# Patient Record
Sex: Male | Born: 1950
Health system: Southern US, Community
[De-identification: ages and names within clinical notes are randomized; demographics above are authoritative.]

## PROBLEM LIST (undated history)

## (undated) DIAGNOSIS — F17200 Nicotine dependence, unspecified, uncomplicated: Secondary | ICD-10-CM

## (undated) DIAGNOSIS — I10 Essential (primary) hypertension: Secondary | ICD-10-CM

## (undated) DIAGNOSIS — Z9109 Other allergy status, other than to drugs and biological substances: Secondary | ICD-10-CM

## (undated) DIAGNOSIS — K649 Unspecified hemorrhoids: Secondary | ICD-10-CM

## (undated) HISTORY — DX: Other allergy status, other than to drugs and biological substances: Z91.09

## (undated) HISTORY — DX: Essential (primary) hypertension: I10

## (undated) HISTORY — PX: HIP SURGERY: SHX245

## (undated) HISTORY — PX: BRAIN SURGERY: SHX531

## (undated) HISTORY — DX: Unspecified hemorrhoids: K64.9

---

## 2003-08-21 ENCOUNTER — Emergency Department (HOSPITAL_COMMUNITY): Admission: EM | Admit: 2003-08-21 | Discharge: 2003-08-21 | Payer: Self-pay | Admitting: Emergency Medicine

## 2004-01-09 ENCOUNTER — Emergency Department (HOSPITAL_COMMUNITY): Admission: EM | Admit: 2004-01-09 | Discharge: 2004-01-09 | Payer: Self-pay | Admitting: Emergency Medicine

## 2007-04-14 ENCOUNTER — Emergency Department (HOSPITAL_COMMUNITY): Admission: EM | Admit: 2007-04-14 | Discharge: 2007-04-15 | Payer: Self-pay | Admitting: Emergency Medicine

## 2010-05-24 HISTORY — PX: HERNIA REPAIR: SHX51

## 2013-03-21 ENCOUNTER — Encounter (HOSPITAL_COMMUNITY): Payer: Self-pay | Admitting: Emergency Medicine

## 2013-03-21 ENCOUNTER — Emergency Department (INDEPENDENT_AMBULATORY_CARE_PROVIDER_SITE_OTHER)
Admission: EM | Admit: 2013-03-21 | Discharge: 2013-03-21 | Disposition: A | Discharge status: Home / Self Care | Payer: Self-pay | Source: Home / Self Care

## 2013-03-21 ENCOUNTER — Emergency Department (INDEPENDENT_AMBULATORY_CARE_PROVIDER_SITE_OTHER): Payer: Self-pay

## 2013-03-21 DIAGNOSIS — S52599A Other fractures of lower end of unspecified radius, initial encounter for closed fracture: Secondary | ICD-10-CM

## 2013-03-21 DIAGNOSIS — S52501A Unspecified fracture of the lower end of right radius, initial encounter for closed fracture: Secondary | ICD-10-CM

## 2013-03-21 MED ORDER — HYDROCODONE-ACETAMINOPHEN 5-325 MG PO TABS: 1.0000 | ORAL_TABLET | ORAL | Status: DC | PRN

## 2013-03-21 MED ORDER — HYDROCODONE-ACETAMINOPHEN 5-325 MG PO TABS
ORAL_TABLET | ORAL | Status: AC
  Filled 2013-03-21: qty 2

## 2013-03-21 MED ORDER — HYDROCODONE-ACETAMINOPHEN 5-325 MG PO TABS
2.0000 | ORAL_TABLET | Freq: Once | ORAL | Status: AC
  Administered 2013-03-21: 2 via ORAL

## 2013-03-21 NOTE — Progress Notes (Signed)
Orthopedic Tech Progress Note Patient Details:  Michael Fields [birth year] [id number] Applied fiberglass volar short arm splint to RUE.  Pulses, capillary refill, motion and sensation intact before and after splinting.  Capillary refill less than 2 seconds.  Applied arm sling to RUE. Ortho Devices Type of Ortho Device: Short arm splint;Arm sling Ortho Device/Splint Location: RUE Ortho Device/Splint Interventions: Application   Lesle Chris 03/21/2013, 1:31 PM

## 2013-03-21 NOTE — ED Provider Notes (Signed)
Medical screening examination/treatment/procedure(s) were performed by non-physician practitioner and as supervising physician I was immediately available for consultation/collaboration.  Leslee Home, M.D.  Reuben Likes, MD 03/21/13 [phone]

## 2013-03-21 NOTE — ED Notes (Signed)
Pt c/o right wrist inj onset yest around 1200 Reports he was walking when he tripped and fell onto sidewalk/concrete [place] on his knees and hands Sxs include: swelling, tenderness... Pain increases w/activity Denies: head inj/LOC Alert w/no signs of acute distress.

## 2013-03-21 NOTE — ED Notes (Signed)
Ortho tech has arrived 

## 2013-03-21 NOTE — ED Provider Notes (Signed)
CSN: [id number]     Arrival date & time 03/21/13  4098 History   First MD Initiated Contact with Patient 03/21/13 1050     Chief Complaint  Patient presents with  . Wrist Injury   (Consider location/radiation/quality/duration/timing/severity/associated sxs/prior Treatment) HPI Comments: This 62 year old man was walking yesterday and experienced a mechanical fall landing on his outstretched right hand. He is complaining of pain primarily to the wrist with swelling to the wrist and hand. Denies injury elsewhere.   History reviewed. No pertinent past medical history. Past Surgical History  Procedure Laterality Date  . Brain surgery     No family history on file. History  Substance Use Topics  . Smoking status: Current Every Day Smoker -- 0.50 packs/day    Types: Cigarettes  . Smokeless tobacco: Not on file  . Alcohol Use: Yes    Review of Systems  Constitutional: Negative.   Respiratory: Negative.   Gastrointestinal: Negative.   Genitourinary: Negative.   Musculoskeletal: Positive for joint swelling. Negative for neck pain and neck stiffness.       As per HPI  Skin: Negative.   Neurological: Negative for dizziness, weakness, numbness and headaches.    Allergies  Review of patient's allergies indicates no known allergies.  Home Medications   Current Outpatient Rx  Name  Route  Sig  Dispense  Refill  . HYDROcodone-acetaminophen (NORCO/VICODIN) 5-325 MG per tablet   Oral   Take 1 tablet by mouth every 4 (four) hours as needed for pain.   20 tablet   0    BP 144/83  Pulse 64  Temp(Src) 98.4 F (36.9 C) (Oral)  Resp 20  SpO2 98% Physical Exam  Nursing note and vitals reviewed. Constitutional: He is oriented to person, place, and time. He appears well-developed and well-nourished.  HENT:  Head: Normocephalic and atraumatic.  Eyes: EOM are normal. Left eye exhibits no discharge.  Neck: Normal range of motion. Neck supple.  Cardiovascular: Normal rate.    Pulmonary/Chest: Effort normal.  Musculoskeletal:  There is swelling and tenderness to the wrist. Radial pulses 1+. There is swelling to the hand but no tenderness to the med carpals. Able to move his digits. Capillary refill is less than 3 seconds. Distal sensation is normal. No tenderness to the proximal forearm, elbow or upper arm.  Neurological: He is alert and oriented to person, place, and time. No cranial nerve deficit.  Skin: Skin is warm and dry.  Psychiatric: He has a normal mood and affect.    ED Course  Procedures (including critical care time) Labs Review Labs Reviewed - No data to display Imaging Review Dg Wrist Complete Right  03/21/2013   CLINICAL DATA:  Status post fall  EXAM: RIGHT WRIST - COMPLETE 3+ VIEW  COMPARISON:  None.  FINDINGS: There is a comminuted, intra-articular fracture deformity involving the distal radius. Fracture fragments are slightly impacted.  No dislocations.  No radiopaque foreign bodies are soft tissue calcifications.  IMPRESSION: 1. Comminuted distal radius fracture appears slightly impacted.   Electronically Signed   By: Signa Kell M.D.   On: 03/21/2013 11:11     \  MDM   1. Closed fracture of right distal radius, initial encounter      Spoke with Molly Maduro, PA at Dr. Ronie Spies office at 11:40 AM. After reviewing the film and discussing with Dr. Mina Marble the recommendation is for [place] to put a plaster splint to the wrist and keep it elevated use a sling and see him in the  office next Tuesday, November 4. He is to call the office to obtain a time for the appointment and also task for [place]. Norco 5 mg every 4 hours when necessary pain #20.  Michael Rasmussen, NP 03/21/13 1230

## 2013-03-29 ENCOUNTER — Encounter (HOSPITAL_BASED_OUTPATIENT_CLINIC_OR_DEPARTMENT_OTHER): Payer: Self-pay | Admitting: *Deleted

## 2013-03-29 ENCOUNTER — Other Ambulatory Visit: Payer: Self-pay | Admitting: Orthopedic Surgery

## 2013-03-29 NOTE — Progress Notes (Signed)
Denies any heart or resp problems but does not see dr-said her had a hernia repair [facility] but no records

## 2013-04-02 ENCOUNTER — Encounter (HOSPITAL_BASED_OUTPATIENT_CLINIC_OR_DEPARTMENT_OTHER): Payer: Self-pay | Admitting: *Deleted

## 2013-04-02 ENCOUNTER — Ambulatory Visit (HOSPITAL_BASED_OUTPATIENT_CLINIC_OR_DEPARTMENT_OTHER): Payer: Self-pay | Admitting: Anesthesiology

## 2013-04-02 ENCOUNTER — Ambulatory Visit (HOSPITAL_BASED_OUTPATIENT_CLINIC_OR_DEPARTMENT_OTHER)
Admission: RE | Admit: 2013-04-02 | Discharge: 2013-04-02 | Disposition: A | Discharge status: Home / Self Care | Payer: Self-pay | Source: Ambulatory Visit | Attending: Orthopedic Surgery | Admitting: Orthopedic Surgery

## 2013-04-02 ENCOUNTER — Encounter (HOSPITAL_BASED_OUTPATIENT_CLINIC_OR_DEPARTMENT_OTHER): Payer: Self-pay | Admitting: Anesthesiology

## 2013-04-02 ENCOUNTER — Encounter (HOSPITAL_BASED_OUTPATIENT_CLINIC_OR_DEPARTMENT_OTHER)
Admission: RE | Disposition: A | Discharge status: Home / Self Care | Payer: Self-pay | Source: Ambulatory Visit | Attending: Orthopedic Surgery

## 2013-04-02 DIAGNOSIS — S52531A Colles' fracture of right radius, initial encounter for closed fracture: Secondary | ICD-10-CM

## 2013-04-02 DIAGNOSIS — S52539A Colles' fracture of unspecified radius, initial encounter for closed fracture: Secondary | ICD-10-CM | POA: Insufficient documentation

## 2013-04-02 DIAGNOSIS — R296 Repeated falls: Secondary | ICD-10-CM | POA: Insufficient documentation

## 2013-04-02 HISTORY — PX: ORIF WRIST FRACTURE: SHX2133

## 2013-04-02 LAB — POCT I-STAT, CHEM 8
BUN: 21 mg/dL (ref 6–23)
Calcium, Ion: 1.08 mmol/L — ABNORMAL LOW (ref 1.13–1.30)
Chloride: 107 mEq/L (ref 96–112)
Creatinine, Ser: 0.9 mg/dL (ref 0.50–1.35)
Glucose, Bld: 94 mg/dL (ref 70–99)
HCT: 43 % (ref 39.0–52.0)
Hemoglobin: 14.6 g/dL (ref 13.0–17.0)
Potassium: 5 mEq/L (ref 3.5–5.1)
Sodium: 139 mEq/L (ref 135–145)
TCO2: 24 mmol/L (ref 0–100)

## 2013-04-02 SURGERY — OPEN REDUCTION INTERNAL FIXATION (ORIF) WRIST FRACTURE
Anesthesia: General | Site: Wrist | Laterality: Right | Wound class: Clean

## 2013-04-02 MED ORDER — CEFAZOLIN SODIUM 1-5 GM-% IV SOLN
INTRAVENOUS | Status: AC
  Filled 2013-04-02: qty 100

## 2013-04-02 MED ORDER — DEXAMETHASONE SODIUM PHOSPHATE 10 MG/ML IJ SOLN
INTRAMUSCULAR | Status: DC | PRN
  Administered 2013-04-02: 10 mg via INTRAVENOUS

## 2013-04-02 MED ORDER — CEFAZOLIN SODIUM-DEXTROSE 2-3 GM-% IV SOLR
2.0000 g | INTRAVENOUS | Status: AC
  Administered 2013-04-02: 2 g via INTRAVENOUS

## 2013-04-02 MED ORDER — FENTANYL CITRATE 0.05 MG/ML IJ SOLN
INTRAMUSCULAR | Status: AC
  Filled 2013-04-02: qty 2

## 2013-04-02 MED ORDER — FENTANYL CITRATE 0.05 MG/ML IJ SOLN
50.0000 ug | INTRAMUSCULAR | Status: DC | PRN
  Administered 2013-04-02: 100 ug via INTRAVENOUS

## 2013-04-02 MED ORDER — LIDOCAINE HCL (CARDIAC) 20 MG/ML IV SOLN
INTRAVENOUS | Status: DC | PRN
  Administered 2013-04-02: 50 mg via INTRAVENOUS

## 2013-04-02 MED ORDER — LACTATED RINGERS IV SOLN
INTRAVENOUS | Status: DC
  Administered 2013-04-02 (×2): via INTRAVENOUS

## 2013-04-02 MED ORDER — PROPOFOL 10 MG/ML IV EMUL
INTRAVENOUS | Status: AC
  Filled 2013-04-02: qty 50

## 2013-04-02 MED ORDER — MIDAZOLAM HCL 5 MG/5ML IJ SOLN
INTRAMUSCULAR | Status: DC | PRN
  Administered 2013-04-02: 2 mg via INTRAVENOUS

## 2013-04-02 MED ORDER — CHLORHEXIDINE GLUCONATE 4 % EX LIQD: 60.0000 mL | Freq: Once | CUTANEOUS | Status: DC

## 2013-04-02 MED ORDER — OXYCODONE-ACETAMINOPHEN 5-325 MG PO TABS: 1.0000 | ORAL_TABLET | ORAL | Status: DC | PRN

## 2013-04-02 MED ORDER — BUPIVACAINE-EPINEPHRINE PF 0.25-1:200000 % IJ SOLN
INTRAMUSCULAR | Status: AC
  Filled 2013-04-02: qty 30

## 2013-04-02 MED ORDER — MIDAZOLAM HCL 2 MG/2ML IJ SOLN
1.0000 mg | INTRAMUSCULAR | Status: DC | PRN
  Administered 2013-04-02: 2 mg via INTRAVENOUS

## 2013-04-02 MED ORDER — HYDROMORPHONE HCL PF 1 MG/ML IJ SOLN: 0.2500 mg | INTRAMUSCULAR | Status: DC | PRN

## 2013-04-02 MED ORDER — ONDANSETRON HCL 4 MG/2ML IJ SOLN: 4.0000 mg | Freq: Once | INTRAMUSCULAR | Status: DC | PRN

## 2013-04-02 MED ORDER — MIDAZOLAM HCL 2 MG/2ML IJ SOLN
INTRAMUSCULAR | Status: AC
  Filled 2013-04-02: qty 2

## 2013-04-02 MED ORDER — BUPIVACAINE HCL (PF) 0.25 % IJ SOLN
INTRAMUSCULAR | Status: AC
  Filled 2013-04-02: qty 30

## 2013-04-02 MED ORDER — PROPOFOL 10 MG/ML IV BOLUS
INTRAVENOUS | Status: DC | PRN
  Administered 2013-04-02: 190 mg via INTRAVENOUS

## 2013-04-02 MED ORDER — ONDANSETRON HCL 4 MG/2ML IJ SOLN
INTRAMUSCULAR | Status: DC | PRN
  Administered 2013-04-02: 4 mg via INTRAVENOUS

## 2013-04-02 SURGICAL SUPPLY — 71 items
APL SKNCLS STERI-STRIP NONHPOA (GAUZE/BANDAGES/DRESSINGS)
BAG DECANTER FOR FLEXI CONT (MISCELLANEOUS) IMPLANT
BANDAGE ELASTIC 3 VELCRO ST LF (GAUZE/BANDAGES/DRESSINGS) IMPLANT
BANDAGE ELASTIC 4 VELCRO ST LF (GAUZE/BANDAGES/DRESSINGS) ×2 IMPLANT
BANDAGE GAUZE ELAST BULKY 4 IN (GAUZE/BANDAGES/DRESSINGS) ×2 IMPLANT
BENZOIN TINCTURE PRP APPL 2/3 (GAUZE/BANDAGES/DRESSINGS) IMPLANT
BIT DRILL 2 FAST STEP (BIT) ×2 IMPLANT
BIT DRILL 2.5X4 QC (BIT) ×2 IMPLANT
BLADE MINI RND TIP GREEN BEAV (BLADE) IMPLANT
BLADE SURG 15 STRL LF DISP TIS (BLADE) ×1 IMPLANT
BLADE SURG 15 STRL SS (BLADE) ×2
BNDG CMPR 9X4 STRL LF SNTH (GAUZE/BANDAGES/DRESSINGS) ×1
BNDG ESMARK 4X9 LF (GAUZE/BANDAGES/DRESSINGS) ×2 IMPLANT
CANISTER SUCT 1200ML W/VALVE (MISCELLANEOUS) IMPLANT
CORDS BIPOLAR (ELECTRODE) ×2 IMPLANT
COVER TABLE BACK 60X90 (DRAPES) ×2 IMPLANT
CUFF TOURNIQUET SINGLE 18IN (TOURNIQUET CUFF) ×2 IMPLANT
DECANTER SPIKE VIAL GLASS SM (MISCELLANEOUS) IMPLANT
DRAPE EXTREMITY T 121X128X90 (DRAPE) ×2 IMPLANT
DRAPE OEC MINIVIEW 54X84 (DRAPES) ×2 IMPLANT
DRAPE SURG 17X23 STRL (DRAPES) ×2 IMPLANT
DURAPREP 26ML APPLICATOR (WOUND CARE) ×2 IMPLANT
ELECT REM PT RETURN 9FT ADLT (ELECTROSURGICAL)
ELECTRODE REM PT RTRN 9FT ADLT (ELECTROSURGICAL) IMPLANT
GAUZE SPONGE 4X4 16PLY XRAY LF (GAUZE/BANDAGES/DRESSINGS) IMPLANT
GAUZE XEROFORM 1X8 LF (GAUZE/BANDAGES/DRESSINGS) ×2 IMPLANT
GLOVE BIO SURGEON STRL SZ 6.5 (GLOVE) ×2 IMPLANT
GLOVE BIO SURGEON STRL SZ8 (GLOVE) ×2 IMPLANT
GLOVE BIOGEL PI IND STRL 7.0 (GLOVE) ×3 IMPLANT
GLOVE BIOGEL PI INDICATOR 7.0 (GLOVE) ×3
GLOVE ECLIPSE 6.5 STRL STRAW (GLOVE) ×4 IMPLANT
GOWN BRE IMP PREV XXLGXLNG (GOWN DISPOSABLE) ×2 IMPLANT
GOWN PREVENTION PLUS XLARGE (GOWN DISPOSABLE) ×6 IMPLANT
NEEDLE HYPO 25X1 1.5 SAFETY (NEEDLE) IMPLANT
NS IRRIG 1000ML POUR BTL (IV SOLUTION) ×2 IMPLANT
PACK BASIN DAY SURGERY FS (CUSTOM PROCEDURE TRAY) ×2 IMPLANT
PAD CAST 3X4 CTTN HI CHSV (CAST SUPPLIES) ×1 IMPLANT
PAD CAST 4YDX4 CTTN HI CHSV (CAST SUPPLIES) IMPLANT
PADDING CAST ABS 4INX4YD NS (CAST SUPPLIES)
PADDING CAST ABS COTTON 4X4 ST (CAST SUPPLIES) IMPLANT
PADDING CAST COTTON 3X4 STRL (CAST SUPPLIES) ×2
PADDING CAST COTTON 4X4 STRL (CAST SUPPLIES)
PEG SUBCHONDRAL SMOOTH 2.0X24 (Peg) ×8 IMPLANT
PEG SUBCHONDRAL SMOOTH 2.0X26 (Peg) ×6 IMPLANT
PENCIL BUTTON HOLSTER BLD 10FT (ELECTRODE) IMPLANT
PLATE STAN 24.4X59.5 RT (Plate) ×2 IMPLANT
SCREW CORT 3.5X14 LNG (Screw) ×4 IMPLANT
SCREW CORT 3.5X16 LNG (Screw) ×2 IMPLANT
SHEET MEDIUM DRAPE 40X70 STRL (DRAPES) ×2 IMPLANT
SLEEVE SCD COMPRESS KNEE MED (MISCELLANEOUS) ×2 IMPLANT
SLING ARM FOAM STRAP LRG (SOFTGOODS) ×2 IMPLANT
SPLINT PLASTER CAST XFAST 3X15 (CAST SUPPLIES) IMPLANT
SPLINT PLASTER CAST XFAST 4X15 (CAST SUPPLIES) ×15 IMPLANT
SPLINT PLASTER XTRA FAST SET 4 (CAST SUPPLIES) ×15
SPLINT PLASTER XTRA FASTSET 3X (CAST SUPPLIES)
SPONGE GAUZE 4X4 12PLY (GAUZE/BANDAGES/DRESSINGS) ×2 IMPLANT
STOCKINETTE 4X48 STRL (DRAPES) ×2 IMPLANT
STRIP CLOSURE SKIN 1/2X4 (GAUZE/BANDAGES/DRESSINGS) IMPLANT
SUCTION FRAZIER TIP 10 FR DISP (SUCTIONS) IMPLANT
SUT ETHILON 4 0 PS 2 18 (SUTURE) IMPLANT
SUT MERSILENE 4 0 P 3 (SUTURE) IMPLANT
SUT PROLENE 3 0 PS 2 (SUTURE) IMPLANT
SUT SILK 2 0 FS (SUTURE) IMPLANT
SUT VIC AB 0 SH 27 (SUTURE) ×2 IMPLANT
SUT VIC AB 3-0 FS2 27 (SUTURE) IMPLANT
SUT VICRYL RAPIDE 4/0 PS 2 (SUTURE) ×2 IMPLANT
SYR BULB 3OZ (MISCELLANEOUS) ×2 IMPLANT
SYRINGE 10CC LL (SYRINGE) IMPLANT
TOWEL OR 17X24 6PK STRL BLUE (TOWEL DISPOSABLE) ×2 IMPLANT
TUBE CONNECTING 20X1/4 (TUBING) IMPLANT
UNDERPAD 30X30 INCONTINENT (UNDERPADS AND DIAPERS) ×2 IMPLANT

## 2013-04-02 NOTE — Transfer of Care (Signed)
Immediate Anesthesia Transfer of Care Note  Patient: Michael Fields  Procedure(s) Performed: Procedure(s): RIGHT OPEN REDUCTION INTERNAL FIXATION (ORIF) WRIST DISTAL RADIUS FRACTURE (Right)  Patient Location: PACU  Anesthesia Type:GA combined with regional for post-op pain  Level of Consciousness: sedated  Airway & Oxygen Therapy: Patient Spontanous Breathing and Patient connected to face mask oxygen  Post-op Assessment: Report given to PACU RN and Post -op Vital signs reviewed and stable  Post vital signs: Reviewed and stable  Complications: No apparent anesthesia complications

## 2013-04-02 NOTE — Anesthesia Procedure Notes (Addendum)
Anesthesia Regional Block:  Supraclavicular block  Pre-Anesthetic Checklist: ,, timeout performed, Correct Patient, Correct Site, Correct Laterality, Correct Procedure, Correct Position, site marked, Risks and benefits discussed,  Surgical consent,  Pre-op evaluation,  At surgeon's request and post-op pain management  Laterality: Left  Prep: chloraprep       Needles:  Injection technique: Single-shot  Needle Type: Echogenic Stimulator Needle      Needle Gauge: 22 and 22 G    Additional Needles:  Procedures: ultrasound guided (picture in chart) and nerve stimulator Supraclavicular block Narrative:  Start time: 04/02/2013 10:35 AM End time: 04/02/2013 10:45 AM Injection made incrementally with aspirations every 5 mL.  Performed by: Personally   Additional Notes: 30 cc 0.5% marcaine with 1:200 epi and 5 mg decadron injected easily  Kipp Brood, MD  Supraclavicular block Procedure Name: LMA Insertion Date/Time: 04/02/2013 11:12 AM Performed by: Burna Cash Pre-anesthesia Checklist: Patient identified, Emergency Drugs available, Suction available and Patient being monitored Patient Re-evaluated:Patient Re-evaluated prior to inductionOxygen Delivery Method: Circle System Utilized Preoxygenation: Pre-oxygenation with 100% oxygen Intubation Type: IV induction Ventilation: Mask ventilation without difficulty LMA: LMA inserted LMA Size: 5.0 Number of attempts: 1 Airway Equipment and Method: bite block Placement Confirmation: positive ETCO2 Tube secured with: Tape Dental Injury: Teeth and Oropharynx as per pre-operative assessment

## 2013-04-02 NOTE — Anesthesia Postprocedure Evaluation (Signed)
  Anesthesia Post-op Note  Patient: Michael Fields  Procedure(s) Performed: Procedure(s): RIGHT OPEN REDUCTION INTERNAL FIXATION (ORIF) WRIST DISTAL RADIUS FRACTURE (Right)  Patient Location: PACU  Anesthesia Type:GA combined with regional for post-op pain  Level of Consciousness: awake, alert  and oriented  Airway and Oxygen Therapy: Patient Spontanous Breathing  Post-op Pain: none  Post-op Assessment: Post-op Vital signs reviewed  Post-op Vital Signs: Reviewed  Complications: No apparent anesthesia complications

## 2013-04-02 NOTE — Op Note (Signed)
See note [id number]

## 2013-04-02 NOTE — Progress Notes (Signed)
Assisted Dr. Joslin with right, ultrasound guided, supraclavicular block. Side rails up, monitors on throughout procedure. See vital signs in flow sheet. Tolerated Procedure well. 
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
## 2013-04-02 NOTE — Anesthesia Preprocedure Evaluation (Signed)
Anesthesia Evaluation  Patient identified by MRN, date of birth, ID band Patient awake    Reviewed: Allergy & Precautions, H&P , NPO status , Patient's Chart, lab work & pertinent test results  Airway Mallampati: II TM Distance: >3 FB Neck ROM: Full    Dental  (+) Edentulous Lower, Loose and Dental Advisory Given,    Pulmonary  breath sounds clear to auscultation        Cardiovascular Rhythm:Regular Rate:Normal     Neuro/Psych    GI/Hepatic   Endo/Other    Renal/GU      Musculoskeletal   Abdominal   Peds  Hematology   Anesthesia Other Findings   Reproductive/Obstetrics                           Anesthesia Physical Anesthesia Plan  ASA: II  Anesthesia Plan: General   Post-op Pain Management:    Induction: Intravenous  Airway Management Planned: LMA  Additional Equipment:   Intra-op Plan:   Post-operative Plan:   Informed Consent: I have reviewed the patients History and Physical, chart, labs and discussed the procedure including the risks, benefits and alternatives for the proposed anesthesia with the patient or authorized representative who has indicated his/her understanding and acceptance.   Dental advisory given  Plan Discussed with: CRNA and Anesthesiologist  Anesthesia Plan Comments: (Fracture R. Wrist ETOH abuse Hx Loose lower front tooth  Plan GA with supraclavicular block  Kipp Brood, MD)        Anesthesia Quick Evaluation

## 2013-04-03 ENCOUNTER — Encounter (HOSPITAL_BASED_OUTPATIENT_CLINIC_OR_DEPARTMENT_OTHER): Payer: Self-pay | Admitting: Orthopedic Surgery

## 2013-04-03 NOTE — Op Note (Deleted)
[name], Fields NO.:  [id number]  MEDICAL RECORD NO.:  [id number]  LOCATION:  [facility]                         FACILITY:  [facility]  PHYSICIAN:  [name], M.D.DATE OF BIRTH:  Oct 28, 1950  DATE OF PROCEDURE:  04/02/2013 DATE OF DISCHARGE:  03/21/2013                              OPERATIVE REPORT   PREOPERATIVE DIAGNOSIS:  Displaced intra-articular fracture, distal radius, right side.  POSTOPERATIVE DIAGNOSIS:  Displaced intra-articular fracture, distal radius, right side.  PROCEDURE:  Open reduction and internal fixation above.  SURGEON:  [name], M.D.  ASSISTANT:  None.  ANESTHESIA:  Supraclavicular block.  Laryngeal mask airway general anesthetic.  COMPLICATION:  None.  DRAINS:  None.  DESCRIPTION OF PROCEDURE:  Patient was taken to the operating suite. After the induction of adequate axillary or supraclavicular laryngeal mask airway anesthetic, right upper extremity was prepped and draped in sterile fashion.  An Esmarch was used to exsanguinate the limb. Tourniquet was inflated to 250 mmHg.  At this point, an incision was made over the palpable border of flexor carpi radialis tendon.  Skin was incised longitudinally to 6-7 cm sheath overlying the FCR was incised. The radial artery was tracked to the lateral side of the FCR to the midline.  The fascia underlying this was incised.  Dissection was carried down to the level of pronator quadratus.  We sub-periosteally stripped to distal radius from the overlying pronator quadratus, exposing the fracture site with a large lunate facet fragment and radial styloid fragment.  Longitudinal traction flexion ulnar deviation was used to reduce the fracture.  We also released the brachioradialis off the distal fragment radially to help aid in reduction.  A standard DVR plate was then fastened to the lower aspect.  Distal radius was fixed to the slotted hole.  We used intraoperative  fluoroscopy to determine adequate position.  We then placed 2 more cortical screws proximally followed by smooth pegs distally.  Intraoperative fluoroscopy revealed adequate reduction in AP, lateral, oblique view.  Wound was irrigated and this was closed in layers of 2-0 undyed Vicryl for the pronator quadratus and a 4-0 Vicryl Rapide subcuticular stitch on the skin.  Steri-Strips, 4 x 4s, fluffs, and a volar splint was applied.  The patient tolerated the procedure well, went to recovery in stable fashion.     Artist Pais Mina Marble, M.D.     MAW/MEDQ  D:  04/02/2013  T:  04/03/2013  Job:  [id number]

## 2013-04-03 NOTE — Op Note (Signed)
Michael Fields, [place]O NO.:  [id number]  MEDICAL RECORD NO.:  [id number]  LOCATION:  [facility]                         FACILITY:  [facility]  PHYSICIAN:  Artist Pais. Devine Dant, M.D.DATE OF BIRTH:  12/19/1950  DATE OF PROCEDURE:  04/02/2013 DATE OF DISCHARGE:  04/02/2013                              OPERATIVE REPORT   PREOPERATIVE DIAGNOSIS:  Displaced intra-articular fracture, distal radius, right side.  POSTOPERATIVE DIAGNOSIS:  Displaced intra-articular fracture, distal radius, right side.  PROCEDURE:  Open reduction and internal fixation above.  SURGEON:  [name], M.D.  ASSISTANT:  None.  ANESTHESIA:  Supraclavicular block.  Laryngeal mask airway general anesthetic.  COMPLICATION:  None.  DRAINS:  None.  DESCRIPTION OF PROCEDURE:  Patient was taken to the operating suite. After the induction of adequate axillary or supraclavicular laryngeal mask airway anesthetic, right upper extremity was prepped and draped in sterile fashion.  An Esmarch was used to exsanguinate the limb. Tourniquet was inflated to 250 mmHg.  At this point, an incision was made over the palpable border of flexor carpi radialis tendon.  Skin was incised longitudinally to 6-7 cm sheath overlying the FCR was incised. The radial artery was tracked to the lateral side of the FCR to the midline.  The fascia underlying this was incised.  Dissection was carried down to the level of pronator quadratus.  We sub-periosteally stripped to distal radius from the overlying pronator quadratus, exposing the fracture site with a large lunate facet fragment and radial styloid fragment.  Longitudinal traction flexion ulnar deviation was used to reduce the fracture.  We also released the brachioradialis off the distal fragment radially to help aid in reduction.  A standard DVR plate was then fastened to the lower aspect.  Distal radius was fixed to the slotted hole.  We used intraoperative  fluoroscopy to determine adequate position.  We then placed 2 more cortical screws proximally followed by smooth pegs distally.  Intraoperative fluoroscopy revealed adequate reduction in AP, lateral, oblique view.  Wound was irrigated and this was closed in layers of 2-0 undyed Vicryl for the pronator quadratus and a 4-0 Vicryl Rapide subcuticular stitch on the skin.  Steri-Strips, 4 x 4s, fluffs, and a volar splint was applied.  The patient tolerated the procedure well, went to recovery in stable fashion.     Artist Pais Mina Marble, M.D.     MAW/MEDQ  D:  04/02/2013  T:  04/03/2013  Job:  [id number]

## 2014-01-14 ENCOUNTER — Emergency Department (HOSPITAL_COMMUNITY): Payer: Medicaid Other

## 2014-01-14 ENCOUNTER — Encounter (HOSPITAL_COMMUNITY): Payer: Self-pay | Admitting: Emergency Medicine

## 2014-01-14 ENCOUNTER — Inpatient Hospital Stay (HOSPITAL_COMMUNITY)
Admission: EM | Admit: 2014-01-14 | Discharge: 2014-01-18 | DRG: 516 | Disposition: A | Discharge status: Home Health | Payer: Medicaid Other | Attending: Orthopedic Surgery | Admitting: Orthopedic Surgery

## 2014-01-14 ENCOUNTER — Inpatient Hospital Stay: Admit: 2014-01-14 | Payer: Self-pay | Admitting: Orthopedic Surgery

## 2014-01-14 DIAGNOSIS — D62 Acute posthemorrhagic anemia: Secondary | ICD-10-CM | POA: Diagnosis not present

## 2014-01-14 DIAGNOSIS — S32509A Unspecified fracture of unspecified pubis, initial encounter for closed fracture: Secondary | ICD-10-CM | POA: Diagnosis present

## 2014-01-14 DIAGNOSIS — F17200 Nicotine dependence, unspecified, uncomplicated: Secondary | ICD-10-CM | POA: Diagnosis present

## 2014-01-14 DIAGNOSIS — S329XXA Fracture of unspecified parts of lumbosacral spine and pelvis, initial encounter for closed fracture: Secondary | ICD-10-CM | POA: Diagnosis present

## 2014-01-14 DIAGNOSIS — S32309A Unspecified fracture of unspecified ilium, initial encounter for closed fracture: Secondary | ICD-10-CM | POA: Diagnosis present

## 2014-01-14 DIAGNOSIS — F1010 Alcohol abuse, uncomplicated: Secondary | ICD-10-CM | POA: Diagnosis present

## 2014-01-14 DIAGNOSIS — S32409A Unspecified fracture of unspecified acetabulum, initial encounter for closed fracture: Principal | ICD-10-CM | POA: Insufficient documentation

## 2014-01-14 DIAGNOSIS — W1789XA Other fall from one level to another, initial encounter: Secondary | ICD-10-CM | POA: Diagnosis present

## 2014-01-14 DIAGNOSIS — S32401A Unspecified fracture of right acetabulum, initial encounter for closed fracture: Secondary | ICD-10-CM

## 2014-01-14 DIAGNOSIS — F1020 Alcohol dependence, uncomplicated: Secondary | ICD-10-CM | POA: Diagnosis present

## 2014-01-14 HISTORY — DX: Nicotine dependence, unspecified, uncomplicated: F17.200

## 2014-01-14 LAB — CBC WITH DIFFERENTIAL/PLATELET
Basophils Absolute: 0 10*3/uL (ref 0.0–0.1)
Basophils Relative: 0 % (ref 0–1)
Eosinophils Absolute: 0 10*3/uL (ref 0.0–0.7)
Eosinophils Relative: 0 % (ref 0–5)
HCT: 40.3 % (ref 39.0–52.0)
Hemoglobin: 13.9 g/dL (ref 13.0–17.0)
Lymphocytes Relative: 6 % — ABNORMAL LOW (ref 12–46)
Lymphs Abs: 0.6 10*3/uL — ABNORMAL LOW (ref 0.7–4.0)
MCH: 31.7 pg (ref 26.0–34.0)
MCHC: 34.5 g/dL (ref 30.0–36.0)
MCV: 92 fL (ref 78.0–100.0)
Monocytes Absolute: 0.8 10*3/uL (ref 0.1–1.0)
Monocytes Relative: 8 % (ref 3–12)
Neutro Abs: 9 10*3/uL — ABNORMAL HIGH (ref 1.7–7.7)
Neutrophils Relative %: 86 % — ABNORMAL HIGH (ref 43–77)
Platelets: 153 10*3/uL (ref 150–400)
RBC: 4.38 MIL/uL (ref 4.22–5.81)
RDW: 13.1 % (ref 11.5–15.5)
WBC: 10.5 10*3/uL (ref 4.0–10.5)

## 2014-01-14 LAB — COMPREHENSIVE METABOLIC PANEL
ALBUMIN: 4 g/dL (ref 3.5–5.2)
ALK PHOS: 49 U/L (ref 39–117)
ALT: 17 U/L (ref 0–53)
AST: 23 U/L (ref 0–37)
Anion gap: 11 (ref 5–15)
BILIRUBIN TOTAL: 0.4 mg/dL (ref 0.3–1.2)
BUN: 15 mg/dL (ref 6–23)
CO2: 24 mEq/L (ref 19–32)
Calcium: 9.8 mg/dL (ref 8.4–10.5)
Chloride: 106 mEq/L (ref 96–112)
Creatinine, Ser: 0.95 mg/dL (ref 0.50–1.35)
GFR calc Af Amer: >90 mL/min (ref >90)
GFR calc non Af Amer: 86 mL/min — ABNORMAL LOW (ref >90)
Glucose, Bld: 131 mg/dL — ABNORMAL HIGH (ref 70–99)
POTASSIUM: 4.4 meq/L (ref 3.7–5.3)
Sodium: 141 mEq/L (ref 137–147)
Total Protein: 6.5 g/dL (ref 6.0–8.3)

## 2014-01-14 MED ORDER — METHOCARBAMOL 1000 MG/10ML IJ SOLN
500.0000 mg | Freq: Four times a day (QID) | INTRAVENOUS | Status: DC | PRN
  Filled 2014-01-14: qty 5

## 2014-01-14 MED ORDER — MORPHINE SULFATE 4 MG/ML IJ SOLN
4.0000 mg | Freq: Once | INTRAMUSCULAR | Status: AC
  Administered 2014-01-14: 4 mg via INTRAMUSCULAR
  Filled 2014-01-14: qty 1

## 2014-01-14 MED ORDER — LORAZEPAM 2 MG/ML IJ SOLN: 0.0000 mg | Freq: Two times a day (BID) | INTRAMUSCULAR | Status: DC

## 2014-01-14 MED ORDER — ONDANSETRON HCL 4 MG/2ML IJ SOLN
4.0000 mg | Freq: Once | INTRAMUSCULAR | Status: AC
  Administered 2014-01-14: 4 mg via INTRAVENOUS
  Filled 2014-01-14: qty 2

## 2014-01-14 MED ORDER — LORAZEPAM 2 MG/ML IJ SOLN: 1.0000 mg | Freq: Four times a day (QID) | INTRAMUSCULAR | Status: DC | PRN

## 2014-01-14 MED ORDER — VITAMIN B-1 100 MG PO TABS
100.0000 mg | ORAL_TABLET | Freq: Every day | ORAL | Status: DC
  Filled 2014-01-14 (×2): qty 1

## 2014-01-14 MED ORDER — HYDROMORPHONE HCL PF 1 MG/ML IJ SOLN
1.0000 mg | INTRAMUSCULAR | Status: DC | PRN
  Administered 2014-01-15 (×2): 1 mg via INTRAVENOUS
  Filled 2014-01-14 (×2): qty 1

## 2014-01-14 MED ORDER — LORAZEPAM 2 MG/ML IJ SOLN
0.0000 mg | Freq: Four times a day (QID) | INTRAMUSCULAR | Status: DC
  Administered 2014-01-14: 2 mg via INTRAVENOUS
  Filled 2014-01-14: qty 1

## 2014-01-14 MED ORDER — FOLIC ACID 1 MG PO TABS
1.0000 mg | ORAL_TABLET | Freq: Every day | ORAL | Status: DC
  Filled 2014-01-14 (×2): qty 1

## 2014-01-14 MED ORDER — TETANUS-DIPHTH-ACELL PERTUSSIS 5-2.5-18.5 LF-MCG/0.5 IM SUSP
0.5000 mL | Freq: Once | INTRAMUSCULAR | Status: AC
  Administered 2014-01-14: 0.5 mL via INTRAMUSCULAR
  Filled 2014-01-14: qty 0.5

## 2014-01-14 MED ORDER — THIAMINE HCL 100 MG/ML IJ SOLN
100.0000 mg | Freq: Every day | INTRAMUSCULAR | Status: DC
  Administered 2014-01-15: 100 mg via INTRAVENOUS
  Filled 2014-01-14 (×2): qty 1

## 2014-01-14 MED ORDER — SODIUM CHLORIDE 0.9 % IV SOLN
INTRAVENOUS | Status: DC
  Administered 2014-01-14: 19:00:00 via INTRAVENOUS

## 2014-01-14 MED ORDER — LORAZEPAM 1 MG PO TABS: 1.0000 mg | ORAL_TABLET | Freq: Four times a day (QID) | ORAL | Status: DC | PRN

## 2014-01-14 MED ORDER — OXYCODONE HCL 5 MG PO TABS
10.0000 mg | ORAL_TABLET | ORAL | Status: DC | PRN
  Administered 2014-01-15 (×2): 10 mg via ORAL
  Filled 2014-01-14 (×2): qty 2

## 2014-01-14 MED ORDER — HYDROMORPHONE HCL PF 1 MG/ML IJ SOLN
1.0000 mg | Freq: Once | INTRAMUSCULAR | Status: AC
  Administered 2014-01-14: 1 mg via INTRAVENOUS
  Filled 2014-01-14: qty 1

## 2014-01-14 MED ORDER — ADULT MULTIVITAMIN W/MINERALS CH
1.0000 | ORAL_TABLET | Freq: Every day | ORAL | Status: DC
  Filled 2014-01-14 (×2): qty 1

## 2014-01-14 MED ORDER — DIPHENHYDRAMINE HCL 50 MG/ML IJ SOLN: 25.0000 mg | Freq: Four times a day (QID) | INTRAMUSCULAR | Status: DC | PRN

## 2014-01-14 MED ORDER — ONDANSETRON HCL 4 MG/2ML IJ SOLN: 4.0000 mg | Freq: Four times a day (QID) | INTRAMUSCULAR | Status: DC | PRN

## 2014-01-14 NOTE — ED Notes (Signed)
Pt reports fell down embankment today while picking up cans; c/o right hip pain; denies LOC with fall; pain with movement, reports numbness in upper leg right.

## 2014-01-14 NOTE — ED Provider Notes (Signed)
Medical screening examination/treatment/procedure(s) were conducted as a shared visit with non-physician practitioner(s) and myself.  I personally evaluated the patient during the encounter.   EKG Interpretation None      Patient here with right hip pain after falling down an embankment. No loss of consciousness. No chest abdominal pain. Right hip x-rays show acetabular fracture. Spoke with orthopedics, Dr. Ninfa Linden, he will come and see the patient and decide if the patient needs to go to [facility] or the operation can be performed here  [name], MD 01/14/14 1525

## 2014-01-14 NOTE — Progress Notes (Signed)
Orthopedic Tech Progress Note Patient Details:  Michael Fields [birth year] [id number]  Ortho Devices Ortho Device/Splint Location: trapeze bar patient helper Ortho Device/Splint Interventions: Application   Hildred Priest 01/14/2014, 9:12 PM
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
## 2014-01-14 NOTE — ED Notes (Signed)
I gave the patient half a cup of ice chips.
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
## 2014-01-14 NOTE — Progress Notes (Signed)
Patient ID: Michael Fields, male   DOB: April 27, 1951, 63 y.o.   MRN: [id number] I have seen Mr. Danielson and examined him fully.  He has seen his x-rays and understands that he has a complex right acetabular/pelvic fracture.  He also understands fully that this will need surgery.  I plan on admitting him and have discussed his case with Dr. Altamese Luverne, Orthopedic Trauma Specialist.  Dr. Marcelino Scot will be consulting in the am and potentially may be proceeding to the OR tomorrow afternoon for definitive fixation.

## 2014-01-15 ENCOUNTER — Inpatient Hospital Stay (HOSPITAL_COMMUNITY): Payer: Medicaid Other

## 2014-01-15 ENCOUNTER — Encounter (HOSPITAL_COMMUNITY)
Admission: EM | Disposition: A | Discharge status: Home Health | Payer: Self-pay | Source: Home / Self Care | Attending: Orthopedic Surgery

## 2014-01-15 ENCOUNTER — Inpatient Hospital Stay (HOSPITAL_COMMUNITY): Payer: Medicaid Other | Admitting: Certified Registered Nurse Anesthetist

## 2014-01-15 ENCOUNTER — Encounter (HOSPITAL_COMMUNITY): Payer: Medicaid Other | Admitting: Certified Registered Nurse Anesthetist

## 2014-01-15 ENCOUNTER — Encounter (HOSPITAL_COMMUNITY): Payer: Self-pay | Admitting: Certified Registered Nurse Anesthetist

## 2014-01-15 HISTORY — PX: ORIF ACETABULAR FRACTURE: SHX5029

## 2014-01-15 LAB — HEPATIC FUNCTION PANEL
ALBUMIN: 3.4 g/dL — AB (ref 3.5–5.2)
ALT: 14 U/L (ref 0–53)
AST: 17 U/L (ref 0–37)
Alkaline Phosphatase: 42 U/L (ref 39–117)
BILIRUBIN TOTAL: 0.8 mg/dL (ref 0.3–1.2)
Bilirubin, Direct: <0.2 mg/dL (ref 0.0–0.3)
Total Protein: 5.7 g/dL — ABNORMAL LOW (ref 6.0–8.3)

## 2014-01-15 LAB — CBC
HCT: 34.3 % — ABNORMAL LOW (ref 39.0–52.0)
Hemoglobin: 11.7 g/dL — ABNORMAL LOW (ref 13.0–17.0)
MCH: 31.8 pg (ref 26.0–34.0)
MCHC: 34.1 g/dL (ref 30.0–36.0)
MCV: 93.2 fL (ref 78.0–100.0)
Platelets: 121 10*3/uL — ABNORMAL LOW (ref 150–400)
RBC: 3.68 MIL/uL — ABNORMAL LOW (ref 4.22–5.81)
RDW: 13.3 % (ref 11.5–15.5)
WBC: 6.3 10*3/uL (ref 4.0–10.5)

## 2014-01-15 LAB — BASIC METABOLIC PANEL
Anion gap: 11 (ref 5–15)
BUN: 11 mg/dL (ref 6–23)
CO2: 24 mEq/L (ref 19–32)
Calcium: 8.6 mg/dL (ref 8.4–10.5)
Chloride: 105 mEq/L (ref 96–112)
Creatinine, Ser: 0.81 mg/dL (ref 0.50–1.35)
GFR calc Af Amer: >90 mL/min (ref >90)
GFR calc non Af Amer: >90 mL/min (ref >90)
GLUCOSE: 106 mg/dL — AB (ref 70–99)
Potassium: 4.1 mEq/L (ref 3.7–5.3)
Sodium: 140 mEq/L (ref 137–147)

## 2014-01-15 LAB — SURGICAL PCR SCREEN
MRSA, PCR: NEGATIVE
STAPHYLOCOCCUS AUREUS: NEGATIVE

## 2014-01-15 LAB — PROTIME-INR
INR: 1.05 (ref 0.00–1.49)
Prothrombin Time: 13.7 seconds (ref 11.6–15.2)

## 2014-01-15 LAB — PREPARE RBC (CROSSMATCH)

## 2014-01-15 LAB — ABO/RH: ABO/RH(D): A POS

## 2014-01-15 LAB — APTT: APTT: 30 s (ref 24–37)

## 2014-01-15 SURGERY — OPEN REDUCTION INTERNAL FIXATION (ORIF) ACETABULAR FRACTURE
Anesthesia: General | Site: Pelvis | Laterality: Right

## 2014-01-15 MED ORDER — METHOCARBAMOL 500 MG PO TABS
1000.0000 mg | ORAL_TABLET | Freq: Four times a day (QID) | ORAL | Status: DC
  Administered 2014-01-16 – 2014-01-18 (×6): 1000 mg via ORAL
  Filled 2014-01-15 (×15): qty 2

## 2014-01-15 MED ORDER — FENTANYL CITRATE 0.05 MG/ML IJ SOLN
INTRAMUSCULAR | Status: AC
Start: 2014-01-15 — End: 2014-01-15
  Filled 2014-01-15: qty 5

## 2014-01-15 MED ORDER — PROPOFOL 10 MG/ML IV BOLUS
INTRAVENOUS | Status: DC | PRN
  Administered 2014-01-15: 150 mg via INTRAVENOUS

## 2014-01-15 MED ORDER — LORAZEPAM 2 MG/ML IJ SOLN: 0.0000 mg | Freq: Four times a day (QID) | INTRAMUSCULAR | Status: DC

## 2014-01-15 MED ORDER — ALBUMIN HUMAN 5 % IV SOLN
INTRAVENOUS | Status: DC | PRN
  Administered 2014-01-15: 15:00:00 via INTRAVENOUS

## 2014-01-15 MED ORDER — DOCUSATE SODIUM 100 MG PO CAPS
100.0000 mg | ORAL_CAPSULE | Freq: Two times a day (BID) | ORAL | Status: DC
  Administered 2014-01-16 – 2014-01-18 (×5): 100 mg via ORAL
  Filled 2014-01-15 (×7): qty 1

## 2014-01-15 MED ORDER — VITAMIN B-1 100 MG PO TABS: 100.0000 mg | ORAL_TABLET | Freq: Every day | ORAL | Status: DC

## 2014-01-15 MED ORDER — METOCLOPRAMIDE HCL 10 MG PO TABS: 5.0000 mg | ORAL_TABLET | Freq: Three times a day (TID) | ORAL | Status: DC | PRN

## 2014-01-15 MED ORDER — VECURONIUM BROMIDE 10 MG IV SOLR
INTRAVENOUS | Status: DC | PRN
  Administered 2014-01-15: 2 mg via INTRAVENOUS

## 2014-01-15 MED ORDER — ROCURONIUM BROMIDE 100 MG/10ML IV SOLN
INTRAVENOUS | Status: DC | PRN
  Administered 2014-01-15 (×2): 50 mg via INTRAVENOUS

## 2014-01-15 MED ORDER — CEFAZOLIN SODIUM-DEXTROSE 2-3 GM-% IV SOLR
2.0000 g | Freq: Once | INTRAVENOUS | Status: AC
  Administered 2014-01-15: 2 g via INTRAVENOUS
  Filled 2014-01-15: qty 50

## 2014-01-15 MED ORDER — NEOSTIGMINE METHYLSULFATE 10 MG/10ML IV SOLN
INTRAVENOUS | Status: DC | PRN
  Administered 2014-01-15: 5 mg via INTRAVENOUS

## 2014-01-15 MED ORDER — ONDANSETRON HCL 4 MG/2ML IJ SOLN
INTRAMUSCULAR | Status: DC | PRN
  Administered 2014-01-15: 4 mg via INTRAVENOUS

## 2014-01-15 MED ORDER — DIPHENHYDRAMINE HCL 12.5 MG/5ML PO ELIX: 12.5000 mg | ORAL_SOLUTION | ORAL | Status: DC | PRN

## 2014-01-15 MED ORDER — THIAMINE HCL 100 MG/ML IJ SOLN: 100.0000 mg | Freq: Every day | INTRAMUSCULAR | Status: DC

## 2014-01-15 MED ORDER — POTASSIUM CHLORIDE IN NACL 20-0.9 MEQ/L-% IV SOLN
INTRAVENOUS | Status: DC
  Administered 2014-01-16 – 2014-01-18 (×3): via INTRAVENOUS
  Filled 2014-01-15 (×7): qty 1000

## 2014-01-15 MED ORDER — OXYCODONE-ACETAMINOPHEN 5-325 MG PO TABS
1.0000 | ORAL_TABLET | ORAL | Status: DC | PRN
  Administered 2014-01-16 – 2014-01-18 (×2): 2 via ORAL
  Filled 2014-01-15 (×2): qty 2

## 2014-01-15 MED ORDER — PROPOFOL 10 MG/ML IV BOLUS
INTRAVENOUS | Status: AC
  Filled 2014-01-15: qty 20

## 2014-01-15 MED ORDER — OXYCODONE HCL 5 MG PO TABS
5.0000 mg | ORAL_TABLET | ORAL | Status: DC | PRN
  Administered 2014-01-16 – 2014-01-18 (×3): 10 mg via ORAL
  Filled 2014-01-15 (×3): qty 2

## 2014-01-15 MED ORDER — FENTANYL CITRATE 0.05 MG/ML IJ SOLN
INTRAMUSCULAR | Status: AC
  Filled 2014-01-15: qty 5

## 2014-01-15 MED ORDER — STERILE WATER FOR INJECTION IJ SOLN
INTRAMUSCULAR | Status: AC
  Filled 2014-01-15: qty 10

## 2014-01-15 MED ORDER — LACTATED RINGERS IV SOLN
INTRAVENOUS | Status: DC
Start: 2014-01-15 — End: 2014-01-15
  Administered 2014-01-15 (×3): via INTRAVENOUS

## 2014-01-15 MED ORDER — FOLIC ACID 1 MG PO TABS: 1.0000 mg | ORAL_TABLET | Freq: Every day | ORAL | Status: DC

## 2014-01-15 MED ORDER — LORAZEPAM 2 MG/ML IJ SOLN
1.0000 mg | Freq: Four times a day (QID) | INTRAMUSCULAR | Status: DC | PRN
  Administered 2014-01-15: 1 mg via INTRAVENOUS

## 2014-01-15 MED ORDER — NEOSTIGMINE METHYLSULFATE 10 MG/10ML IV SOLN
INTRAVENOUS | Status: AC
  Filled 2014-01-15: qty 1

## 2014-01-15 MED ORDER — GLYCOPYRROLATE 0.2 MG/ML IJ SOLN
INTRAMUSCULAR | Status: AC
  Filled 2014-01-15: qty 4

## 2014-01-15 MED ORDER — METOCLOPRAMIDE HCL 5 MG/ML IJ SOLN
5.0000 mg | Freq: Three times a day (TID) | INTRAMUSCULAR | Status: DC | PRN
  Administered 2014-01-16: 10 mg via INTRAVENOUS
  Filled 2014-01-15 (×2): qty 2

## 2014-01-15 MED ORDER — ARTIFICIAL TEARS OP OINT
TOPICAL_OINTMENT | OPHTHALMIC | Status: DC | PRN
  Administered 2014-01-15: 1 via OPHTHALMIC

## 2014-01-15 MED ORDER — VECURONIUM BROMIDE 10 MG IV SOLR
INTRAVENOUS | Status: AC
  Filled 2014-01-15: qty 10

## 2014-01-15 MED ORDER — LORAZEPAM 2 MG/ML IJ SOLN
INTRAMUSCULAR | Status: AC
Start: 2014-01-15 — End: 2014-01-16
  Filled 2014-01-15: qty 1

## 2014-01-15 MED ORDER — WARFARIN VIDEO: Freq: Once | Status: DC

## 2014-01-15 MED ORDER — CEFAZOLIN SODIUM 1-5 GM-% IV SOLN
1.0000 g | Freq: Four times a day (QID) | INTRAVENOUS | Status: AC
  Administered 2014-01-15 – 2014-01-16 (×3): 1 g via INTRAVENOUS
  Filled 2014-01-15 (×4): qty 50

## 2014-01-15 MED ORDER — FENTANYL CITRATE 0.05 MG/ML IJ SOLN
INTRAMUSCULAR | Status: DC | PRN
  Administered 2014-01-15: 50 ug via INTRAVENOUS
  Administered 2014-01-15: 100 ug via INTRAVENOUS
  Administered 2014-01-15 (×2): 50 ug via INTRAVENOUS
  Administered 2014-01-15 (×2): 100 ug via INTRAVENOUS
  Administered 2014-01-15: 150 ug via INTRAVENOUS

## 2014-01-15 MED ORDER — ARTIFICIAL TEARS OP OINT
TOPICAL_OINTMENT | OPHTHALMIC | Status: AC
  Filled 2014-01-15: qty 3.5

## 2014-01-15 MED ORDER — HYDROMORPHONE HCL PF 1 MG/ML IJ SOLN: 0.2500 mg | INTRAMUSCULAR | Status: DC | PRN

## 2014-01-15 MED ORDER — ONDANSETRON HCL 4 MG/2ML IJ SOLN
4.0000 mg | Freq: Four times a day (QID) | INTRAMUSCULAR | Status: DC | PRN
  Administered 2014-01-16: 4 mg via INTRAVENOUS
  Filled 2014-01-15: qty 2

## 2014-01-15 MED ORDER — LORAZEPAM 1 MG PO TABS: 1.0000 mg | ORAL_TABLET | Freq: Four times a day (QID) | ORAL | Status: DC | PRN

## 2014-01-15 MED ORDER — BISACODYL 5 MG PO TBEC: 5.0000 mg | DELAYED_RELEASE_TABLET | Freq: Every day | ORAL | Status: DC | PRN

## 2014-01-15 MED ORDER — GLYCOPYRROLATE 0.2 MG/ML IJ SOLN
INTRAMUSCULAR | Status: DC | PRN
  Administered 2014-01-15: .8 mg via INTRAVENOUS

## 2014-01-15 MED ORDER — SUCCINYLCHOLINE CHLORIDE 20 MG/ML IJ SOLN
INTRAMUSCULAR | Status: AC
  Filled 2014-01-15: qty 1

## 2014-01-15 MED ORDER — MIDAZOLAM HCL 2 MG/2ML IJ SOLN
INTRAMUSCULAR | Status: AC
  Filled 2014-01-15: qty 2

## 2014-01-15 MED ORDER — METHOCARBAMOL 1000 MG/10ML IJ SOLN: 500.0000 mg | Freq: Four times a day (QID) | INTRAVENOUS | Status: DC

## 2014-01-15 MED ORDER — WARFARIN - PHARMACIST DOSING INPATIENT
Freq: Every day | Status: DC
  Administered 2014-01-16: 1

## 2014-01-15 MED ORDER — EPHEDRINE SULFATE 50 MG/ML IJ SOLN
INTRAMUSCULAR | Status: AC
  Filled 2014-01-15: qty 1

## 2014-01-15 MED ORDER — ROCURONIUM BROMIDE 50 MG/5ML IV SOLN
INTRAVENOUS | Status: AC
  Filled 2014-01-15: qty 1

## 2014-01-15 MED ORDER — ONDANSETRON HCL 4 MG PO TABS: 4.0000 mg | ORAL_TABLET | Freq: Four times a day (QID) | ORAL | Status: DC | PRN

## 2014-01-15 MED ORDER — ONDANSETRON HCL 4 MG/2ML IJ SOLN
INTRAMUSCULAR | Status: AC
  Filled 2014-01-15: qty 2

## 2014-01-15 MED ORDER — CHLORHEXIDINE GLUCONATE 4 % EX LIQD
Freq: Once | CUTANEOUS | Status: DC
Start: 2014-01-15 — End: 2014-01-15
  Filled 2014-01-15: qty 15

## 2014-01-15 MED ORDER — 0.9 % SODIUM CHLORIDE (POUR BTL) OPTIME
TOPICAL | Status: DC | PRN
  Administered 2014-01-15: 1000 mL

## 2014-01-15 MED ORDER — MIDAZOLAM HCL 5 MG/5ML IJ SOLN
INTRAMUSCULAR | Status: DC | PRN
  Administered 2014-01-15: 2 mg via INTRAVENOUS

## 2014-01-15 MED ORDER — WARFARIN SODIUM 7.5 MG PO TABS
7.5000 mg | ORAL_TABLET | ORAL | Status: AC
  Administered 2014-01-15: 7.5 mg via ORAL
  Filled 2014-01-15: qty 1

## 2014-01-15 MED ORDER — POLYETHYLENE GLYCOL 3350 17 G PO PACK
17.0000 g | PACK | Freq: Every day | ORAL | Status: DC
  Administered 2014-01-16 – 2014-01-18 (×3): 17 g via ORAL
  Filled 2014-01-15 (×6): qty 1

## 2014-01-15 MED ORDER — ADULT MULTIVITAMIN W/MINERALS CH: 1.0000 | ORAL_TABLET | Freq: Every day | ORAL | Status: DC

## 2014-01-15 MED ORDER — PANTOPRAZOLE SODIUM 40 MG PO TBEC
40.0000 mg | DELAYED_RELEASE_TABLET | Freq: Every day | ORAL | Status: DC
  Administered 2014-01-15 – 2014-01-18 (×4): 40 mg via ORAL
  Filled 2014-01-15 (×5): qty 1

## 2014-01-15 MED ORDER — LIDOCAINE HCL (CARDIAC) 20 MG/ML IV SOLN
INTRAVENOUS | Status: DC | PRN
  Administered 2014-01-15: 50 mg via INTRAVENOUS

## 2014-01-15 MED ORDER — LIDOCAINE HCL (CARDIAC) 20 MG/ML IV SOLN
INTRAVENOUS | Status: AC
  Filled 2014-01-15: qty 5

## 2014-01-15 MED ORDER — HYDROMORPHONE HCL PF 1 MG/ML IJ SOLN
0.5000 mg | INTRAMUSCULAR | Status: DC | PRN
  Administered 2014-01-16: 1 mg via INTRAVENOUS
  Filled 2014-01-15: qty 1

## 2014-01-15 MED ORDER — LORAZEPAM 2 MG/ML IJ SOLN: 0.0000 mg | Freq: Two times a day (BID) | INTRAMUSCULAR | Status: DC

## 2014-01-15 MED ORDER — CEFAZOLIN SODIUM-DEXTROSE 2-3 GM-% IV SOLR
INTRAVENOUS | Status: DC | PRN
  Administered 2014-01-15: 2 g via INTRAVENOUS

## 2014-01-15 MED ORDER — SODIUM CHLORIDE 0.9 % IR SOLN
Status: DC | PRN
  Administered 2014-01-15: 3000 mL

## 2014-01-15 MED ORDER — COUMADIN BOOK
Freq: Once | Status: AC
  Administered 2014-01-16: 05:00:00
  Filled 2014-01-15 (×2): qty 1

## 2014-01-15 SURGICAL SUPPLY — 78 items
APPLIER CLIP 11 MED OPEN (CLIP)
BLADE SURG ROTATE 9660 (MISCELLANEOUS) ×3 IMPLANT
BRUSH SCRUB DISP (MISCELLANEOUS) ×6 IMPLANT
CLIP APPLIE 11 MED OPEN (CLIP) IMPLANT
CLOSURE WOUND 1/2 X4 (GAUZE/BANDAGES/DRESSINGS)
COVER SURGICAL LIGHT HANDLE (MISCELLANEOUS) ×3 IMPLANT
DRAIN CHANNEL 10F 3/8 F FF (DRAIN) IMPLANT
DRAIN CHANNEL 15F RND FF W/TCR (WOUND CARE) IMPLANT
DRAPE C-ARM 42X72 X-RAY (DRAPES) ×3 IMPLANT
DRAPE C-ARMOR (DRAPES) ×3 IMPLANT
DRAPE INCISE IOBAN 66X45 STRL (DRAPES) IMPLANT
DRAPE INCISE IOBAN 85X60 (DRAPES) ×6 IMPLANT
DRAPE ORTHO SPLIT 77X108 STRL (DRAPES) ×4
DRAPE SURG ORHT 6 SPLT 77X108 (DRAPES) ×2 IMPLANT
DRAPE U-SHAPE 47X51 STRL (DRAPES) ×3 IMPLANT
DRSG ADAPTIC 3X8 NADH LF (GAUZE/BANDAGES/DRESSINGS) IMPLANT
DRSG MEPILEX BORDER 4X8 (GAUZE/BANDAGES/DRESSINGS) ×3 IMPLANT
DRSG PAD ABDOMINAL 8X10 ST (GAUZE/BANDAGES/DRESSINGS) IMPLANT
ELECT BLADE 6.5 EXT (BLADE) IMPLANT
ELECT REM PT RETURN 9FT ADLT (ELECTROSURGICAL) ×3
ELECTRODE REM PT RTRN 9FT ADLT (ELECTROSURGICAL) ×1 IMPLANT
EVACUATOR 1/8 PVC DRAIN (DRAIN) IMPLANT
EVACUATOR SILICONE 100CC (DRAIN) IMPLANT
GAUZE SPONGE 4X4 12PLY STRL (GAUZE/BANDAGES/DRESSINGS) IMPLANT
GAUZE SPONGE 4X4 16PLY XRAY LF (GAUZE/BANDAGES/DRESSINGS) IMPLANT
GLOVE BIO SURGEON STRL SZ7.5 (GLOVE) ×3 IMPLANT
GLOVE BIO SURGEON STRL SZ8 (GLOVE) ×3 IMPLANT
GLOVE BIOGEL PI IND STRL 7.5 (GLOVE) ×1 IMPLANT
GLOVE BIOGEL PI IND STRL 8 (GLOVE) ×1 IMPLANT
GLOVE BIOGEL PI INDICATOR 7.5 (GLOVE) ×2
GLOVE BIOGEL PI INDICATOR 8 (GLOVE) ×2
GOWN STRL REUS W/ TWL LRG LVL3 (GOWN DISPOSABLE) ×3 IMPLANT
GOWN STRL REUS W/ TWL XL LVL3 (GOWN DISPOSABLE) ×1 IMPLANT
GOWN STRL REUS W/TWL 2XL LVL3 (GOWN DISPOSABLE) ×3 IMPLANT
GOWN STRL REUS W/TWL LRG LVL3 (GOWN DISPOSABLE) ×6
GOWN STRL REUS W/TWL XL LVL3 (GOWN DISPOSABLE) ×3
HANDPIECE INTERPULSE COAX TIP (DISPOSABLE) ×3
K-WIRE 3.2X150 (WIRE) ×3
KIT BASIN OR (CUSTOM PROCEDURE TRAY) ×3 IMPLANT
KIT ROOM TURNOVER OR (KITS) ×3 IMPLANT
KWIRE 3.2X150 (WIRE) ×1 IMPLANT
LIGHT ORTHO (MISCELLANEOUS) IMPLANT
LOOP VESSEL MAXI BLUE (MISCELLANEOUS) IMPLANT
MANIFOLD NEPTUNE II (INSTRUMENTS) ×3 IMPLANT
NEEDLE MAYO TROCAR (NEEDLE) IMPLANT
NS IRRIG 1000ML POUR BTL (IV SOLUTION) ×3 IMPLANT
PACK TOTAL JOINT (CUSTOM PROCEDURE TRAY) ×3 IMPLANT
PAD ARMBOARD 7.5X6 YLW CONV (MISCELLANEOUS) ×6 IMPLANT
PIPE LIGHT STORZ FITTING (MISCELLANEOUS) ×3 IMPLANT
PLATE PROXIMAL ACULOC 2 WD RT (Plate) ×3 IMPLANT
PLATE SUPRAPECTINEAL PELVIS (Plate) ×3 IMPLANT
RETRIEVER SUT HEWSON (MISCELLANEOUS) IMPLANT
SCREW 3.5X46MM (Screw) ×9 IMPLANT
SCREW CORTEX ST MATTA 3.5X26MM (Screw) ×3 IMPLANT
SCREW CORTEX ST MATTA 3.5X28MM (Screw) ×3 IMPLANT
SCREW CORTEX ST MATTA 3.5X30MM (Screw) ×9 IMPLANT
SCREW CORTEX ST MATTA 3.5X36MM (Screw) ×9 IMPLANT
SCREW CORTEX ST MATTA 3.5X38M (Screw) ×9 IMPLANT
SCREW CORTEX ST MATTA 3.5X50MM (Screw) ×3 IMPLANT
SCREW CORTEX ST MATTA 3.5X60MM (Screw) ×3 IMPLANT
SCREW CORTEX ST MATTA 3.5X75MM (Screw) ×3 IMPLANT
SET HNDPC FAN SPRY TIP SCT (DISPOSABLE) ×1 IMPLANT
SPONGE LAP 18X18 X RAY DECT (DISPOSABLE) ×9 IMPLANT
STAPLER VISISTAT 35W (STAPLE) ×3 IMPLANT
STRIP CLOSURE SKIN 1/2X4 (GAUZE/BANDAGES/DRESSINGS) IMPLANT
SUCTION FRAZIER TIP 10 FR DISP (SUCTIONS) ×3 IMPLANT
SUT FIBERWIRE #2 38 T-5 BLUE (SUTURE)
SUT VIC AB 0 CT1 27 (SUTURE) ×3
SUT VIC AB 0 CT1 27XBRD ANBCTR (SUTURE) ×1 IMPLANT
SUT VIC AB 1 CT1 18XCR BRD 8 (SUTURE) ×1 IMPLANT
SUT VIC AB 1 CT1 8-18 (SUTURE) ×2
SUT VIC AB 2-0 CT1 27 (SUTURE) ×2
SUT VIC AB 2-0 CT1 TAPERPNT 27 (SUTURE) ×1 IMPLANT
SUTURE FIBERWR #2 38 T-5 BLUE (SUTURE) IMPLANT
TOWEL OR 17X24 6PK STRL BLUE (TOWEL DISPOSABLE) ×3 IMPLANT
TOWEL OR 17X26 10 PK STRL BLUE (TOWEL DISPOSABLE) ×6 IMPLANT
TRAY FOLEY CATH 16FRSI W/METER (SET/KITS/TRAYS/PACK) ×3 IMPLANT
WATER STERILE IRR 1000ML POUR (IV SOLUTION) IMPLANT
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
## 2014-01-15 NOTE — Brief Op Note (Signed)
01/14/2014 - 01/15/2014  6:21 PM  PATIENT:  Michael Fields  63 y.o. male  PRE-OPERATIVE DIAGNOSIS:  RIGHT acetabulum T type fracture  POST-OPERATIVE DIAGNOSIS:  RIGHT acetabulum T type fracture  PROCEDURE:  Procedure(s): RIGHT OPEN REDUCTION INTERNAL FIXATION (ORIF) ACETABULAR FRACTURE (Right), T Type (involving both columns)  SURGEON:  Surgeon(s) and Role:    * Rozanna Box, MD - Primary  PHYSICIAN ASSISTANT: Ainsley Spinner, PA-C  ANESTHESIA:   general  I/O:  Total I/O In: 3935 [I.V.:3000; Blood:685; IV Piggyback:250] Out: 1400 [Urine:300; Blood:1100]  SPECIMEN:  No Specimen  TOURNIQUET:  * No tourniquets in log *  DICTATION: .Other Dictation: Dictation Number [id number]

## 2014-01-15 NOTE — Progress Notes (Signed)
ANTICOAGULATION CONSULT NOTE - Initial Consult  Pharmacy Consult for Coumadin Indication: VTE prophylaxis  No Known Allergies  Patient Measurements: Weight: 173 lb 2 oz (78.529 kg)  Vital Signs: Temp: 97.4 F (36.3 C) (08/25 1945) Temp src: Oral (08/25 1240) BP: 126/65 mmHg (08/25 1945) Pulse Rate: 92 (08/25 1945)  Labs:  Recent Labs  01/14/14 1203 01/15/14 0827 01/15/14 0850  HGB 13.9  --  11.7*  HCT 40.3  --  34.3*  PLT 153  --  121*  APTT  --   --  30  LABPROT  --   --  13.7  INR  --   --  1.05  CREATININE 0.95 0.81  --     The CrCl is unknown because both a height and weight (above a minimum accepted value) are required for this calculation.   Medical History: Past Medical History  Diagnosis Date  . Wears dentures     top  . Wears glasses     readers  . Alcohol dependence   . Nicotine dependence     Medications:  No prescriptions prior to admission    Assessment: 63 yo M who fell down an embankment ~ 10-15 feet 8/24 with resulting R hip pain.  Xrays showed comminuted displaced fractures involving the roof and medial walls of the RIGHT acetabulum and the anterior column. Additional fractures involving the RIGHT superior and inferior pubic rami, RIGHT iliac wing anteriorly and inferiorly, and posterior column RIGHT acetabulum.  Pt is now s/p ORIF right acetabular fracture repair.  To start Coumadin for post-op VTE prophylaxis.  Coumadin predictor points = 5  Goal of Therapy:  INR 2-3 Monitor platelets by anticoagulation protocol: Yes   Plan:  Coumadin 7.5 mg PO x 1 tonight. Daily INR. Coumadin education materials ordered.  [employer], Pharm.D., BCPS Clinical Pharmacist Pager [phone] 01/15/2014 9:28 PM

## 2014-01-15 NOTE — ED Provider Notes (Signed)
Medical screening examination/treatment/procedure(s) were conducted as a shared visit with non-physician practitioner(s) and myself.  I personally evaluated the patient during the encounter.   EKG Interpretation   Date/Time:  Monday January 14 2014 16:13:36 EDT Ventricular Rate:  63 PR Interval:  178 QRS Duration: 94 QT Interval:  390 QTC Calculation: 399 R Axis:   -8 Text Interpretation:  Sinus rhythm No significant change was found  Confirmed by Wyvonnia Dusky  MD, STEPHEN [id number]) on 01/14/2014 4:18:21 PM       [name], MD 01/15/14 [phone]

## 2014-01-15 NOTE — Progress Notes (Signed)
Patient ID: Michael Fields, male   DOB: 10/12/1950, 63 y.o.   MRN: [id number] [name] is awake and alert this am.  Reports some right hip and groin pain.  Comfortable on current pain meds.  CBC ordered for this am.  Vitals stable.  Good strength grossly in right foot.  He understands fully that he is on the OR schedule this afternoon for fixation of his complex right-sided pelvic fractures by Dr. Marcelino Scot, Ortho Trauma.  He is NPO.

## 2014-01-15 NOTE — Progress Notes (Signed)
Utilization review completed.  

## 2014-01-15 NOTE — Anesthesia Procedure Notes (Signed)
Procedure Name: Intubation Date/Time: 01/15/2014 2:39 PM Performed by: [place] Pink Pre-anesthesia Checklist: Patient identified, Emergency Drugs available, Suction available and Timeout performed Patient Re-evaluated:Patient Re-evaluated prior to inductionOxygen Delivery Method: Circle system utilized Preoxygenation: Pre-oxygenation with 100% oxygen Intubation Type: IV induction Ventilation: Mask ventilation without difficulty and Oral airway inserted - appropriate to patient size Laryngoscope Size: Mac and 3 Grade View: Grade I Tube type: Oral Tube size: 7.5 mm Number of attempts: 1 Airway Equipment and Method: Stylet Placement Confirmation: ETT inserted through vocal cords under direct vision,  positive ETCO2 and breath sounds checked- equal and bilateral Secured at: 21 cm Tube secured with: Tape Dental Injury: Teeth and Oropharynx as per pre-operative assessment

## 2014-01-15 NOTE — Anesthesia Postprocedure Evaluation (Signed)
  Anesthesia Post-op Note  Patient: Michael Fields  Procedure(s) Performed: Procedure(s): RIGHT OPEN REDUCTION INTERNAL FIXATION (ORIF) ACETABULAR FRACTURE (Right)  Patient Location: PACU  Anesthesia Type:General  Level of Consciousness: awake  Airway and Oxygen Therapy: Patient Spontanous Breathing  Post-op Pain: mild  Post-op Assessment: Post-op Vital signs reviewed  Post-op Vital Signs: Reviewed  Last Vitals:  Filed Vitals:   01/15/14 1240  BP: 120/67  Pulse: 67  Temp: 37.6 C  Resp: 18    Complications: No apparent anesthesia complications

## 2014-01-15 NOTE — Transfer of Care (Signed)
Immediate Anesthesia Transfer of Care Note  Patient: Michael Fields  Procedure(s) Performed: Procedure(s): RIGHT OPEN REDUCTION INTERNAL FIXATION (ORIF) ACETABULAR FRACTURE (Right)  Patient Location: PACU  Anesthesia Type:General  Level of Consciousness: awake  Airway & Oxygen Therapy: Patient Spontanous Breathing and Patient connected to face mask oxygen  Post-op Assessment: Report given to PACU RN  Post vital signs: Reviewed and stable  Complications: No apparent anesthesia complications

## 2014-01-15 NOTE — Anesthesia Preprocedure Evaluation (Addendum)
Anesthesia Evaluation  Patient identified by MRN, date of birth, ID band Patient awake    Reviewed: Allergy & Precautions, H&P , NPO status , Patient's Chart, lab work & pertinent test results  Airway Mallampati: II      Dental   Pulmonary Current Smoker,  breath sounds clear to auscultation        Cardiovascular negative cardio ROS  Rhythm:Regular Rate:Normal     Neuro/Psych    GI/Hepatic negative GI ROS, Neg liver ROS,   Endo/Other  negative endocrine ROS  Renal/GU negative Renal ROS     Musculoskeletal   Abdominal   Peds  Hematology   Anesthesia Other Findings   Reproductive/Obstetrics                          Anesthesia Physical Anesthesia Plan  ASA: III  Anesthesia Plan: General   Post-op Pain Management:    Induction: Intravenous  Airway Management Planned: Oral ETT  Additional Equipment:   Intra-op Plan:   Post-operative Plan: Extubation in OR  Informed Consent: I have reviewed the patients History and Physical, chart, labs and discussed the procedure including the risks, benefits and alternatives for the proposed anesthesia with the patient or authorized representative who has indicated his/her understanding and acceptance.   Dental advisory given  Plan Discussed with: CRNA and Anesthesiologist  Anesthesia Plan Comments:         Anesthesia Quick Evaluation

## 2014-01-15 NOTE — Consult Note (Signed)
I have seen and examined the patient. I agree with the findings above.  R acetab fracture, T type RLE No traumatic wounds, ecchymosis, or rash  Nontender  No effusions  Knee stable to varus/ valgus and anterior/posterior stress  Sens DPN, SPN, TN intact  Motor EHL, ext, flex, evers 5/5  DP 2+, PT 2+, No significant edema   I discussed with the patient the risks and benefits of surgery for his right acetabular fracture, including the possibility of infection, nerve injury, vessel injury, wound breakdown, arthritis, urologic injury, malunion, nonunion, symptomatic hardware, DVT/ PE, loss of motion, and need for further surgery among others.  He acknowledged these risks and wished to proceed.  Rozanna Box, MD 01/15/2014 2:17 PM

## 2014-01-16 ENCOUNTER — Inpatient Hospital Stay (HOSPITAL_COMMUNITY): Payer: Medicaid Other

## 2014-01-16 ENCOUNTER — Encounter (HOSPITAL_COMMUNITY): Payer: Self-pay | Admitting: *Deleted

## 2014-01-16 LAB — PROTIME-INR
INR: 1.2 (ref 0.00–1.49)
PROTHROMBIN TIME: 15.2 s (ref 11.6–15.2)

## 2014-01-16 LAB — BASIC METABOLIC PANEL
Anion gap: 10 (ref 5–15)
BUN: 8 mg/dL (ref 6–23)
CO2: 26 mEq/L (ref 19–32)
Calcium: 8.2 mg/dL — ABNORMAL LOW (ref 8.4–10.5)
Chloride: 102 mEq/L (ref 96–112)
Creatinine, Ser: 0.87 mg/dL (ref 0.50–1.35)
GFR calc Af Amer: >90 mL/min (ref >90)
GFR, EST NON AFRICAN AMERICAN: 89 mL/min — AB (ref >90)
GLUCOSE: 131 mg/dL — AB (ref 70–99)
Potassium: 3.8 mEq/L (ref 3.7–5.3)
SODIUM: 138 meq/L (ref 137–147)

## 2014-01-16 LAB — CBC
HEMATOCRIT: 29.2 % — AB (ref 39.0–52.0)
HEMOGLOBIN: 10.3 g/dL — AB (ref 13.0–17.0)
MCH: 31.1 pg (ref 26.0–34.0)
MCHC: 35.3 g/dL (ref 30.0–36.0)
MCV: 88.2 fL (ref 78.0–100.0)
Platelets: 104 10*3/uL — ABNORMAL LOW (ref 150–400)
RBC: 3.31 MIL/uL — ABNORMAL LOW (ref 4.22–5.81)
RDW: 14.1 % (ref 11.5–15.5)
WBC: 7.3 10*3/uL (ref 4.0–10.5)

## 2014-01-16 LAB — POCT I-STAT 4, (NA,K, GLUC, HGB,HCT)
Glucose, Bld: 122 mg/dL — ABNORMAL HIGH (ref 70–99)
Glucose, Bld: 132 mg/dL — ABNORMAL HIGH (ref 70–99)
HCT: 25 % — ABNORMAL LOW (ref 39.0–52.0)
HCT: 30 % — ABNORMAL LOW (ref 39.0–52.0)
Hemoglobin: 8.5 g/dL — ABNORMAL LOW (ref 13.0–17.0)
Hemoglobin: 10.2 g/dL — ABNORMAL LOW (ref 13.0–17.0)
Potassium: 3.8 mEq/L (ref 3.7–5.3)
Potassium: 4.2 meq/L (ref 3.7–5.3)
Sodium: 136 mEq/L — ABNORMAL LOW (ref 137–147)
Sodium: 135 meq/L — ABNORMAL LOW (ref 137–147)

## 2014-01-16 MED ORDER — LORAZEPAM 2 MG/ML IJ SOLN
1.0000 mg | Freq: Four times a day (QID) | INTRAMUSCULAR | Status: DC | PRN
Start: 2014-01-16 — End: 2014-01-18

## 2014-01-16 MED ORDER — ENSURE COMPLETE PO LIQD
237.0000 mL | Freq: Two times a day (BID) | ORAL | Status: DC
  Administered 2014-01-17 – 2014-01-18 (×3): 237 mL via ORAL

## 2014-01-16 MED ORDER — ADULT MULTIVITAMIN W/MINERALS CH
1.0000 | ORAL_TABLET | Freq: Every day | ORAL | Status: DC
  Administered 2014-01-16 – 2014-01-18 (×3): 1 via ORAL
  Filled 2014-01-16 (×3): qty 1

## 2014-01-16 MED ORDER — ACETAMINOPHEN 325 MG PO TABS
650.0000 mg | ORAL_TABLET | Freq: Four times a day (QID) | ORAL | Status: DC | PRN
Start: 2014-01-16 — End: 2014-01-18
  Administered 2014-01-16: 650 mg via ORAL
  Filled 2014-01-16: qty 2

## 2014-01-16 MED ORDER — LORAZEPAM 1 MG PO TABS: 1.0000 mg | ORAL_TABLET | Freq: Four times a day (QID) | ORAL | Status: DC | PRN

## 2014-01-16 MED ORDER — BISACODYL 10 MG RE SUPP
10.0000 mg | Freq: Once | RECTAL | Status: AC
  Administered 2014-01-16: 10 mg via RECTAL
  Filled 2014-01-16: qty 1

## 2014-01-16 MED ORDER — METOCLOPRAMIDE HCL 5 MG/ML IJ SOLN
10.0000 mg | Freq: Four times a day (QID) | INTRAMUSCULAR | Status: DC
  Administered 2014-01-17: 10 mg via INTRAVENOUS
  Filled 2014-01-16 (×5): qty 2

## 2014-01-16 MED ORDER — WARFARIN SODIUM 7.5 MG PO TABS
7.5000 mg | ORAL_TABLET | Freq: Once | ORAL | Status: AC
  Administered 2014-01-16: 7.5 mg via ORAL
  Filled 2014-01-16: qty 1

## 2014-01-16 MED ORDER — FOLIC ACID 1 MG PO TABS
1.0000 mg | ORAL_TABLET | Freq: Every day | ORAL | Status: DC
Start: 2014-01-16 — End: 2014-01-18
  Administered 2014-01-16 – 2014-01-18 (×3): 1 mg via ORAL
  Filled 2014-01-16 (×3): qty 1

## 2014-01-16 MED ORDER — THIAMINE HCL 100 MG/ML IJ SOLN
100.0000 mg | Freq: Every day | INTRAMUSCULAR | Status: DC
  Filled 2014-01-16 (×3): qty 1

## 2014-01-16 MED ORDER — METOCLOPRAMIDE HCL 10 MG PO TABS
10.0000 mg | ORAL_TABLET | Freq: Three times a day (TID) | ORAL | Status: DC
Start: 2014-01-16 — End: 2014-01-16

## 2014-01-16 MED ORDER — BISACODYL 10 MG RE SUPP
10.0000 mg | Freq: Once | RECTAL | Status: AC
  Administered 2014-01-16: 10 mg via RECTAL

## 2014-01-16 MED ORDER — METOCLOPRAMIDE HCL 5 MG/ML IJ SOLN
10.0000 mg | Freq: Three times a day (TID) | INTRAMUSCULAR | Status: DC
  Administered 2014-01-16: 10 mg via INTRAVENOUS

## 2014-01-16 MED ORDER — METOCLOPRAMIDE HCL 5 MG/ML IJ SOLN: 10.0000 mg | Freq: Three times a day (TID) | INTRAMUSCULAR | Status: DC

## 2014-01-16 MED ORDER — VITAMIN B-1 100 MG PO TABS
100.0000 mg | ORAL_TABLET | Freq: Every day | ORAL | Status: DC
  Administered 2014-01-16 – 2014-01-18 (×3): 100 mg via ORAL
  Filled 2014-01-16 (×3): qty 1

## 2014-01-16 NOTE — Progress Notes (Signed)
Pt complaining of gas and abd pain. He states he is passing min flatus. Abd appears to be slightly distended but has BS times four and has vomited one time. I admin a supp and zofran IV. Notified Luna Glasgow PA on call for Dr  Marcelino Scot. New order to admin another supp times one as needed and to get a stat KUB and called results to her ASAP. Report given to [facility] for the night shift. She will monitor Pts condition and call results of KUB.

## 2014-01-16 NOTE — Care Management Note (Signed)
CARE MANAGEMENT NOTE 01/16/2014  Patient:  Michael Fields, Michael Fields   Account Number:  [id number]  Date Initiated:  01/16/2014  Documentation initiated by:  Ricki Miller  Subjective/Objective Assessment:   63 yr old male admitted with right acetabular fracture, s/p ORIF of right T type acetabulum fracture.     Action/Plan:   CIR to evaluate patient.  CM will continue to monitor.   Anticipated DC Date:     Anticipated DC Plan:           Choice offered to / List presented to:             Status of service:  In process, will continue to follow

## 2014-01-16 NOTE — Progress Notes (Signed)
[facility] Progress Note  Subjective  Significant pain in R hip abd sore Somewhat agitated this am   No flatus, no bm- trying to sit on bedpan Foley in place   Review of Systems  Constitutional: Negative for fever and chills.  Eyes: Negative for blurred vision and double vision.  Respiratory: Negative for shortness of breath and wheezing.   Cardiovascular: Negative for chest pain and palpitations.  Gastrointestinal: Positive for abdominal pain. Negative for nausea and vomiting.  Genitourinary:       Foely  Musculoskeletal:       R hip pain   Neurological: Negative for tingling, sensory change and headaches.     Objective   BP 118/53  Pulse 71  Temp(Src) 99.9 F (37.7 C) (Oral)  Resp 15  Wt 78.472 kg (173 lb)  SpO2 97%  Intake/Output     08/25 0701 - 08/26 0700 08/26 0701 - 08/27 0700   P.O. 0 75   I.V. (mL/kg) 3000 (38.2)    Blood 685    IV Piggyback 250    Total Intake(mL/kg) 3935 (50.1) 75 (1)   Urine (mL/kg/hr) 1600 (0.8) 175 (0.8)   Blood 1100 (0.6)    Total Output 2700 175   Net +1235 -100          Labs  Results for AZAN, MANERI (MRN [id number]) as of 01/16/2014 10:02  Ref. Range 01/16/2014 05:17  Sodium Latest Range: 137-147 mEq/L 138  Potassium Latest Range: 3.7-5.3 mEq/L 3.8  Chloride Latest Range: 96-112 mEq/L 102  CO2 Latest Range: 19-32 mEq/L 26  BUN Latest Range: 6-23 mg/dL 8  Creatinine Latest Range: 0.50-1.35 mg/dL 0.87  Calcium Latest Range: 8.4-10.5 mg/dL 8.2 (L)  GFR calc non Af Amer Latest Range: >90 mL/min 89 (L)  GFR calc Af Amer Latest Range: >90 mL/min >90  Glucose Latest Range: 70-99 mg/dL 131 (H)  Anion gap Latest Range: 5-15  10  WBC Latest Range: 4.0-10.5 K/uL 7.3  RBC Latest Range: 4.22-5.81 MIL/uL 3.31 (L)  Hemoglobin Latest Range: 13.0-17.0 g/dL 10.3 (L)  HCT Latest Range: 39.0-52.0 % 29.2 (L)  MCV Latest Range: 78.0-100.0 fL 88.2  MCH Latest Range: 26.0-34.0 pg 31.1  MCHC Latest Range: 30.0-36.0 g/dL  35.3  RDW Latest Range: 11.5-15.5 % 14.1  Platelets Latest Range: 150-400 K/uL 104 (L)  Prothrombin Time Latest Range: 11.6-15.2 seconds 15.2  INR Latest Range: 0.00-1.49  1.20    Exam  Gen: Awake and alert, resting comfortably in bed Lungs: Clear to auscultation bilaterally Cardiac: Regular rate and rhythm, S1 and S2 Abd: + Bowel sounds, mild distention, nontender Pelvis: Dressing is clean, dry and intact. Urine is clear in the Foley bag Ext:       Right lower extremity  Swelling is controlled  SCDs in place  Extremity is warm  Palpable dorsalis pedis pulses noted  Distal motor and sensory functions are intact  Compartments are soft and nontender   Assessment and Plan   POD/HD#: 1  63 y/o male s/p fall down embankment with complex R acetabulum fracture  1. Fall  2. R T-type acetabulum fx with protrusion of quadrilateral surface status post ORIF                            TDWB x 8 weeks              posterior hip precautions  PT/OT   Ice as needed  TED hose  Dressing change tomorrow  3. EtOH dependence             CIWA protocol  4. Nicotine dependence  No nicotine products as these delay bone and wound healing  5. Pain management:  Percocet 5/325 1-2 q6h prn  Oxy ir 5mg  1-2 q3h prn  Robaxin 1000 mg q6h  Dilaudid IV for severe breakthrough pain   6. ABL anemia/Hemodynamics  Stable  Received 2 units PRBCs in OR   Cbc in am    7. DVT/PE prophylaxis:  lovenox bridge to coumadin   8. ID:   periop ancef  9. Activity:  TDWB R leg  PT/OT consults  Posterior hip precautions R hip   10. FEN/Foley/Lines:  Advance to full liquids  Monitor for ileus  Will add reglan   Continue with foley for another 24 hours for strict I&O monitoring   11. Impediments to fracture healing:  Nicotine dependence  EtOH abuse  12. Dispo            therapies today  Pt has no place to go after hospital stay, may be able to stay with daughter, this needs to be  confirmed   Will likely need short term snf  Will get CIR consult as well     Jari Pigg, PA-C [facility] [phone] (P) 01/16/2014 9:59 AM  **Disclaimer: This note may have been dictated with voice recognition software. Similar sounding words can inadvertently be transcribed and this note may contain transcription errors which may not have been corrected upon publication of note.**

## 2014-01-16 NOTE — Progress Notes (Signed)
ANTICOAGULATION CONSULT NOTE - Initial Consult  Pharmacy Consult for Coumadin Indication: VTE prophylaxis  No Known Allergies  Patient Measurements: Weight: 173 lb (78.472 kg)  Vital Signs: Temp: 99.9 F (37.7 C) (08/26 0923) Temp src: Oral (08/26 0923) BP: 118/53 mmHg (08/26 0923) Pulse Rate: 71 (08/26 0923)  Labs:  Recent Labs  01/14/14 1203 01/15/14 0827 01/15/14 0850 01/15/14 1558 01/15/14 1652 01/16/14 0517  HGB 13.9  --  11.7* 8.5* 10.2* 10.3*  HCT 40.3  --  34.3* 25.0* 30.0* 29.2*  PLT 153  --  121*  --   --  104*  APTT  --   --  30  --   --   --   LABPROT  --   --  13.7  --   --  15.2  INR  --   --  1.05  --   --  1.20  CREATININE 0.95 0.81  --   --   --  0.87    The CrCl is unknown because both a height and weight (above a minimum accepted value) are required for this calculation.   Medical History: Past Medical History  Diagnosis Date  . Wears dentures     top  . Wears glasses     readers  . Alcohol dependence   . Nicotine dependence     Medications:  No prescriptions prior to admission    Assessment: 63 yo M who fell down an embankment ~ 10-15 feet 8/24 with resulting R hip pain.  Xrays shows multiple fractures.  Pt is now s/p ORIF of R acetabular fracture.  To start Coumadin for post-op VTE prophylaxis.  INR ins 1.2 this morning, remains low as expected after 1 dose of warfarin.  Goal of Therapy:  INR 2-3 Monitor platelets by anticoagulation protocol: Yes   Plan:  Coumadin 7.5 mg PO x 1 Daily INR. Monitor for s/sx bleeding   Hughes Better, PharmD, BCPS Clinical Pharmacist Pager: [phone] 01/16/2014 10:11 AM

## 2014-01-16 NOTE — Evaluation (Signed)
Physical Therapy Evaluation Patient Details Name: Michael Fields MRN: [id number] DOB: Sep 26, 1950 Today's Date: 01/16/2014   History of Present Illness  63 y.o. male Rt T-type acetabulum fx with protrusion of quadrilateral surface status post ORIF  Clinical Impression  Patient is seen following the above procedure, presents with functional limitations due to the deficits listed below (see PT Problem List). Tolerating exercises well and motivated to improve. Min-Mod assist +2 with majority of mobility assessed today. Unable to safely ambulate at this time, limited by pain and fatigue, but performed stand pivot transfer with PT today.Patient will benefit from skilled PT to increase their independence and safety with mobility to allow discharge to the venue listed below.       Follow Up Recommendations CIR    Equipment Recommendations  Rolling walker with 5" wheels;3in1 (PT);Other (comment) ((possibly W/c depending on progression- hold at this time))    Recommendations for Other Services OT consult;Rehab consult     Precautions / Restrictions Precautions Precautions: Posterior Hip Precaution Comments: Reviewed posterior hip precautions Restrictions Weight Bearing Restrictions: Yes RLE Weight Bearing: Touchdown weight bearing      Mobility  Bed Mobility Overal bed mobility: Needs Assistance;+ 2 for safety/equipment Bed Mobility: Supine to Sit     Supine to sit: Min assist;HOB elevated     General bed mobility comments: Min assist for trunk control with HOB elevated. VC for technique  Transfers Overall transfer level: Needs assistance Equipment used: Rolling walker (2 wheeled) Transfers: Sit to/from [facility] Sit to Stand: Mod assist;+2 physical assistance;+2 safety/equipment;Min assist Stand pivot transfers: Min assist;+2 safety/equipment       General transfer comment: Mod assist +2 for initial stand from lowest bed setting for boost and to maintain TDWB  on RLE. Progressed to Mod assist +1 for boost to standing position. VC for hand placement and to elevate RLE for decreased weight bearing in standing. Min assist +2 for safety with pivot transfer. Attempted to step but unable due to UE weakness and pain in pelvis. Needs assist to descend slowing into chair. Demonstrates ability to control RW with min assist.  Ambulation/Gait             General Gait Details: Unable to tolerate single step at this time. Reports pelvic pain and arms too weak.  Stairs            Wheelchair Mobility    Modified Rankin (Stroke Patients Only)       Balance Overall balance assessment: Needs assistance Sitting-balance support: No upper extremity supported;Feet supported Sitting balance-Leahy Scale: Fair     Standing balance support: Bilateral upper extremity supported Standing balance-Leahy Scale: Poor                               Pertinent Vitals/Pain Pain Assessment: 0-10 Pain Score: 3  Pain Location: right groin area Pain Descriptors / Indicators: Burning;Sharp Pain Intervention(s): Limited activity within patient's tolerance;Monitored during session;Repositioned    Home Living Family/patient expects to be discharged to:: Unsure Living Arrangements: Children;Other relatives (asking daughter/sister if he can stay with them) Available Help at Discharge: Family;Available 24 hours/day Type of Home: House Home Access: Stairs to enter Entrance Stairs-Rails: None Entrance Stairs-Number of Steps: 1 Home Layout: One level Home Equipment: None      Prior Function Level of Independence: Independent               Hand Dominance   Dominant Hand:  Right    Extremity/Trunk Assessment   Upper Extremity Assessment: Defer to OT evaluation           Lower Extremity Assessment: RLE deficits/detail RLE Deficits / Details: Decreased strength and ROM as expected post op. Reports mild numbness of LEs bilaterally however,  able to accurately identify all areas in feet when assessed with light touch exam.       Communication   Communication: No difficulties  Cognition Arousal/Alertness: Awake/alert Behavior During Therapy: WFL for tasks assessed/performed Overall Cognitive Status: Within Functional Limits for tasks assessed                      General Comments General comments (skin integrity, edema, etc.): Time spent discussing safety with mobility, importance of being out of bed and frequently mobilizing with assistance. Discussed d/c planning and role of PT acutely to improve patient's functional mobility.    Exercises General Exercises - Lower Extremity Ankle Circles/Pumps: AROM;Both;10 reps;Supine Quad Sets: Strengthening;10 reps;Seated;Both Gluteal Sets: Strengthening;Both;10 reps;Seated Straight Leg Raises: Right;5 reps;AAROM;Supine      Assessment/Plan    PT Assessment Patient needs continued PT services  PT Diagnosis Difficulty walking;Abnormality of gait;Acute pain   PT Problem List Decreased strength;Decreased range of motion;Decreased activity tolerance;Decreased balance;Decreased mobility;Decreased knowledge of use of DME;Decreased knowledge of precautions;Pain  PT Treatment Interventions DME instruction;Gait training;Functional mobility training;Therapeutic activities;Therapeutic exercise;Balance training;Neuromuscular re-education;Patient/family education;Modalities   PT Goals (Current goals can be found in the Care Plan section) Acute Rehab PT Goals Patient Stated Goal: none stated PT Goal Formulation: With patient Time For Goal Achievement: 01/23/14 Potential to Achieve Goals: Good    Frequency Min 5X/week   Barriers to discharge Inaccessible home environment;Decreased caregiver support Will need to confirm place to stay - states he may be able to stay with sister or daugther.    Co-evaluation               End of Session   Activity Tolerance: Patient limited  by fatigue;Patient limited by pain Patient left: in chair;with call bell/phone within reach Nurse Communication: Mobility status         Time: 1116-1150 PT Time Calculation (min): 34 min   Charges:   PT Evaluation $Initial PT Evaluation Tier I: 1 Procedure PT Treatments $Therapeutic Activity: 8-22 mins $Self Care/Home Management: 8-22   PT G Codes:        Elayne Snare, [place]    Ellouise Newer 01/16/2014, 1:37 PM

## 2014-01-16 NOTE — Op Note (Signed)
NAMEHUNG, RHINESMITH NO.:  [id number]  MEDICAL RECORD NO.:  [id number]  LOCATION:  5N07C                        FACILITY:  [facility]  PHYSICIAN:  Astrid Divine. Marcelino Scot, M.D. DATE OF BIRTH:  May 08, 1951  DATE OF PROCEDURE:  01/15/2014 DATE OF DISCHARGE:                              OPERATIVE REPORT   PREOPERATIVE DIAGNOSIS:  Right T-type acetabular fracture.  POSTOPERATIVE DIAGNOSIS:  Right T-type acetabular fracture.  PROCEDURE:  Open reduction and internal fixation of right T-type acetabulum involving both columns through an anterior Stoppa approach.  SURGEON:  Astrid Divine. Marcelino Scot, MD  ASSISTANT:  Jari Pigg, PA-C  ANESTHESIA:  General.  COMPLICATIONS:  None.  I/O:  3000 mL crystalloid, 2 units of packed cells, 250 mL colloid/1400 mL (UOP 300 mL, EBL 1100 mL).  SPECIMENS:  None.  DRAINS:  None.  DISPOSITION:  To PACU.  CONDITION:  Stable.  BRIEF SUMMARY AND INDICATION FOR PROCEDURE:  Sameer Teeple is a 63 year old male, who was collecting cans when he fell on the grass sustaining a comminuted and displaced right acetabular fracture.  He was initially seen and evaluated by Dr. Ninfa Linden who recognized the scope of injury and requested a consultation and definitive management from the Orthopedic Trauma Service, believing this patient's injuries were best managed by fellowship trained orthopedic traumatologist.  I was happy to see the patient in consultation and recommended a repair.  The patient did have comminution and marginal impaction through the dome of his acetabulum.  I discussed with him the risk of complications associated with repair including failure to prevent arthritis, loss of motion, need for further surgery including eventual total hip arthroplasty, nerve injury, vessel injury, urologic injury, DVT, PE, heart attack, stroke, and many others.  The patient acknowledges risks and did wish to proceed.  BRIEF DESCRIPTION OF PROCEDURE:  Mr.  Perfect was taken to the operating room where general anesthesia was induced.  His right hip was flexed to about 45 degrees on several blankets.  The heel was loaded from the bed. His abdomen was prepped and draped in usual sterile fashion.  A standard Pfannenstiel incision was made except for the fact that on the left side I did curve it more proximally following the old hernia repair incision. Dissection was carried down to the brim where the rectus was divided from its insertion.  A longitudinal limb was made in the rectus as well. The dissection was carried around the brim where exposure was readily obtained.  The patient did not have a corona mortis vessel that required ligation.  Once the fracture was exposed, however, he began to ooze rather significantly from the cancellous bone surfaces and this did not abate until reduction was obtained.  I elevated along the anterior column with a long handle comb and also exposing the quadrilateral plate in similar manner.  I used a malleable to protect the bladder at all times and also used a smaller malleable to retract the obturators nerve. I was able to expose the quadrilateral plate down to the ischium and also to expose and clean out the fracture site using a curette and a Pulsavac.  Hematoma was removed.  A offset ball  spike reduction clamp was then used to appose the anterior column and the quadrilateral plate where it had been impacted inward.  My assistant pulled longitudinal traction on the leg to assist with reduction.  Because of comminution, I was unable to pin it.  I did use the quadrilateral plate with a handle on the anterior aspect of it to push outward and down to reduce the fracture.  I followed this then with initial set of screws anteriorly and posterior lagging the far cortex and compressing the plate down to the bone.  Images were then obtained with C-arm to confirm appropriate plate placement and reduction.  This was  followed then by further placement of multiple screws and definitive fixation.  I was unable to effectively access the anterior aspect of the dome and reduce the entirety of the impaction there.  Final images showed appropriate reduction, hardware placement, trajectory and length.  Wound was irrigated thoroughly and then closed in standard layered fashion using #1 figure-of-eight Vicryl, 0 Vicryl, 2-0 Vicryl, and 3-0 nylon.  Sterile gently compressive dressing was applied.  The patient was then awakened and taken to the PACU in stable condition.  It should be noted that on multiple views all screws were completely free of the joint. Furthermore, I did wish not to encroach too closely upon the joint surface such that if the patient did require arthroplasty in the future that no hardware removal would be required.  Ainsley Spinner, PA-C assisted me throughout and his assistance was absolutely necessary to safely retract the bladder as well as the femoral nerve and vessels and he also assisted me with reduction as noted above.  PROGNOSIS:  The patient will be touchdown weightbearing.  I am concerned about his ability to comply with postop protocol because of his alcohol use and potential for complications is significant if he is noncompliant.  Furthermore, he did have some impaction of the dome that may lead to early arthritis as well.  That being said, we were able to restore integrity to the anterior and posterior columns as well as to restore the quadrilateral plate and eliminate protrusio.  The patient will have formal DVT prophylaxis pharmacologically while he is in the hospital and if feasible, this will be  continued postoperatively, though we may need to convert to aspirin depending upon financial feasibility.     Astrid Divine. Marcelino Scot, M.D.     MHH/MEDQ  D:  01/15/2014  T:  01/15/2014  Job:  [id number]

## 2014-01-16 NOTE — Progress Notes (Signed)
Occupational Therapy Evaluation Patient Details Name: Michael Fields MRN: [id number] DOB: [birth year] Today's Date: 01/16/2014    History of Present Illness 63 y.o. male Rt T-type acetabulum fx with protrusion of quadrilateral surface status post ORIF   Clinical Impression   PTA pt was independent with ADLs and functional mobility. Pt declined OOB activities this date due to fatigue but is motivated to participate in therapy. Educated pt on safety with ADLs and fall prevention strategies. Pt demonstrates with slight tricep weakness and per PT note, this limits mobility with use of RW. Provided pt with theraband level 3 (green) and written HEP for UE exercises to increase tricep/shoulder strength. Pt would benefit from skilled OT to increase UE strength, independence with LB ADLs, and toilet transfer.     Follow Up Recommendations  CIR;Supervision/Assistance - 24 hour    Equipment Recommendations  3 in 1 bedside comode    Recommendations for Other Services       Precautions / Restrictions Precautions Precautions: Posterior Hip Precaution Comments: Reviewed posterior hip precautions Restrictions Weight Bearing Restrictions: Yes RLE Weight Bearing: Touchdown weight bearing      Mobility Bed Mobility Overal bed mobility: Needs Assistance;+ 2 for safety/equipment Bed Mobility: Supine to Sit;Sit to Supine     Supine to sit: Min assist;HOB elevated Sit to supine: Min assist;HOB elevated   General bed mobility comments: Min assist for trunk control with HOB elevated. VC for technique. (A) for RLE management.  Transfers                 General transfer comment: Pt declined OOB activities due to fatigue.          ADL Overall ADL's : Needs assistance/impaired Eating/Feeding: Set up;Sitting   Grooming: Set up;Sitting   Upper Body Bathing: Set up;Sitting   Lower Body Bathing: Sitting/lateral leans;Minimal assistance   Upper Body Dressing : Set up;Sitting                      General ADL Comments: Pt declined OOB activities this date due to fatigue, however sat EOB with OT to assess trunk control and sitting balance. Pt able to perform grooming activities sitting EOB. Provided pt with theraband and written HEP to increase tricep and shoulder strength for improved use of RW for mobility. Pt returned demo of therapeutic exercises.     Vision  Pt reports no change from baseline.   No apparent visual deficits.                  Perception Perception Perception Tested?: No   Praxis Praxis Praxis tested?: Within functional limits    Pertinent Vitals/Pain Pain Assessment: 0-10 Pain Score: 2  Pain Location: Rt groin area Pain Descriptors / Indicators: Burning;Sharp Pain Intervention(s): Limited activity within patient's tolerance;Monitored during session     Hand Dominance Right   Extremity/Trunk Assessment Upper Extremity Assessment Upper Extremity Assessment: Overall WFL for tasks assessed (slight Bil tricep weakness (4+/5) which limits mobility with)   Lower Extremity Assessment Lower Extremity Assessment: Defer to PT evaluation   Cervical / Trunk Assessment Cervical / Trunk Assessment: Normal   Communication Communication Communication: No difficulties   Cognition Arousal/Alertness: Awake/alert Behavior During Therapy: WFL for tasks assessed/performed Overall Cognitive Status: Within Functional Limits for tasks assessed                        Exercises Exercises: Other exercises Other Exercises Other Exercises: provided pt with theraband  level 3 (green) and written HEP with pictures for tricep and shoulder exercises to increase strength to promote independence with RW. Encouraged pt to perform 10 reps each with both arm. Pt demonstrated understanding.         Home Living Family/patient expects to be discharged to:: Unsure Living Arrangements: Children;Other relatives (asking daughter/sister if he can stay  with them) Available Help at Discharge: Family;Available 24 hours/day Type of Home: House Home Access: Stairs to enter [employer] of Steps: 1 Entrance Stairs-Rails: None Home Layout: One level               Home Equipment: None          Prior Functioning/Environment Level of Independence: Independent             OT Diagnosis: Generalized weakness;Acute pain   OT Problem List: Decreased strength;Decreased range of motion;Decreased activity tolerance;Impaired balance (sitting and/or standing);Decreased safety awareness;Decreased knowledge of precautions;Decreased knowledge of use of DME or AE;Pain   OT Treatment/Interventions: Self-care/ADL training;Therapeutic exercise;Energy conservation;DME and/or AE instruction;Therapeutic activities;Patient/family education;Balance training    OT Goals(Current goals can be found in the care plan section) Acute Rehab OT Goals Patient Stated Goal: home OT Goal Formulation: With patient Time For Goal Achievement: 01/23/14 Potential to Achieve Goals: Good  OT Frequency: Min 2X/week   Barriers to D/C: Other (comment)  Pt unsure of d/c location          End of Session Equipment Utilized During Treatment: Other (comment) (theraband (green) level 3)  Activity Tolerance: Patient limited by fatigue Patient left: in bed;with call bell/phone within reach;with family/visitor present   Time: 2248-2500 OT Time Calculation (min): 29 min Charges:  OT General Charges $OT Visit: 1 Procedure OT Evaluation $Initial OT Evaluation Tier I: 1 Procedure OT Treatments $Therapeutic Exercise: 8-22 mins  Juluis Rainier [phone] 01/16/2014, 3:59 PM
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
## 2014-01-17 DIAGNOSIS — F17200 Nicotine dependence, unspecified, uncomplicated: Secondary | ICD-10-CM | POA: Diagnosis present

## 2014-01-17 DIAGNOSIS — W19XXXA Unspecified fall, initial encounter: Secondary | ICD-10-CM

## 2014-01-17 DIAGNOSIS — S329XXA Fracture of unspecified parts of lumbosacral spine and pelvis, initial encounter for closed fracture: Secondary | ICD-10-CM

## 2014-01-17 DIAGNOSIS — F1010 Alcohol abuse, uncomplicated: Secondary | ICD-10-CM | POA: Diagnosis present

## 2014-01-17 LAB — CBC
HCT: 27.6 % — ABNORMAL LOW (ref 39.0–52.0)
Hemoglobin: 9.7 g/dL — ABNORMAL LOW (ref 13.0–17.0)
MCH: 31.7 pg (ref 26.0–34.0)
MCHC: 35.1 g/dL (ref 30.0–36.0)
MCV: 90.2 fL (ref 78.0–100.0)
Platelets: 104 10*3/uL — ABNORMAL LOW (ref 150–400)
RBC: 3.06 MIL/uL — ABNORMAL LOW (ref 4.22–5.81)
RDW: 13.6 % (ref 11.5–15.5)
WBC: 7.6 10*3/uL (ref 4.0–10.5)

## 2014-01-17 LAB — BASIC METABOLIC PANEL
ANION GAP: 10 (ref 5–15)
BUN: 7 mg/dL (ref 6–23)
CO2: 25 mEq/L (ref 19–32)
CREATININE: 0.83 mg/dL (ref 0.50–1.35)
Calcium: 8.7 mg/dL (ref 8.4–10.5)
Chloride: 103 mEq/L (ref 96–112)
GFR calc Af Amer: >90 mL/min (ref >90)
Glucose, Bld: 116 mg/dL — ABNORMAL HIGH (ref 70–99)
Potassium: 4.1 mEq/L (ref 3.7–5.3)
SODIUM: 138 meq/L (ref 137–147)

## 2014-01-17 LAB — PROTIME-INR
INR: 1.69 — ABNORMAL HIGH (ref 0.00–1.49)
PROTHROMBIN TIME: 19.9 s — AB (ref 11.6–15.2)

## 2014-01-17 MED ORDER — ADULT MULTIVITAMIN W/MINERALS CH: 1.0000 | ORAL_TABLET | Freq: Every day | ORAL | Status: DC

## 2014-01-17 MED ORDER — METOCLOPRAMIDE HCL 5 MG/ML IJ SOLN
10.0000 mg | Freq: Four times a day (QID) | INTRAMUSCULAR | Status: AC
  Administered 2014-01-17 – 2014-01-18 (×4): 10 mg via INTRAVENOUS
  Filled 2014-01-17 (×2): qty 2

## 2014-01-17 MED ORDER — DSS 100 MG PO CAPS: 100.0000 mg | ORAL_CAPSULE | Freq: Two times a day (BID) | ORAL | Status: DC

## 2014-01-17 MED ORDER — WARFARIN SODIUM 5 MG PO TABS
5.0000 mg | ORAL_TABLET | Freq: Once | ORAL | Status: AC
  Administered 2014-01-17: 5 mg via ORAL
  Filled 2014-01-17: qty 1

## 2014-01-17 MED ORDER — OXYCODONE-ACETAMINOPHEN 5-325 MG PO TABS: 1.0000 | ORAL_TABLET | Freq: Four times a day (QID) | ORAL | Status: DC | PRN

## 2014-01-17 MED ORDER — PANTOPRAZOLE SODIUM 40 MG PO TBEC: 40.0000 mg | DELAYED_RELEASE_TABLET | Freq: Every day | ORAL | Status: DC

## 2014-01-17 MED ORDER — METHOCARBAMOL 500 MG PO TABS: 500.0000 mg | ORAL_TABLET | Freq: Four times a day (QID) | ORAL | Status: DC | PRN

## 2014-01-17 MED ORDER — ENSURE COMPLETE PO LIQD: 237.0000 mL | Freq: Two times a day (BID) | ORAL | Status: DC

## 2014-01-17 MED ORDER — OXYCODONE HCL 5 MG PO TABS: 5.0000 mg | ORAL_TABLET | Freq: Four times a day (QID) | ORAL | Status: DC | PRN

## 2014-01-17 NOTE — Discharge Instructions (Addendum)
[facility] Discharge Instructions   General Discharge Instructions  WEIGHT BEARING STATUS: Touchdown weightbearing right leg  RANGE OF MOTION/ACTIVITY: Posterior hip precautions right hip, unrestricted range of motion right ankle and knee  Wound care: Daily dry dressing changes to right hip. Okay to shower. Can leave wound open to air once dry. See more detailed instruction below  Diet: as you were eating previously.  Can use over the counter stool softeners and bowel preparations, such as Miralax, to help with bowel movements.  Narcotics can be constipating.  Be sure to drink plenty of fluids  STOP SMOKING OR USING NICOTINE PRODUCTS!!!!  As discussed nicotine severely impairs your body's ability to heal surgical and traumatic wounds but also impairs bone healing.  Wounds and bone heal by forming microscopic blood vessels (angiogenesis) and nicotine is a vasoconstrictor (essentially, shrinks blood vessels).  Therefore, if vasoconstriction occurs to these microscopic blood vessels they essentially disappear and are unable to deliver necessary nutrients to the healing tissue.  This is one modifiable factor that you can do to dramatically increase your chances of healing your injury.    (This means no smoking, no nicotine gum, patches, etc)  DO NOT USE NONSTEROIDAL ANTI-INFLAMMATORY DRUGS (NSAID'S)  Using products such as Advil (ibuprofen), Aleve (naproxen), Motrin (ibuprofen) for additional pain control during fracture healing can delay and/or prevent the healing response.  If you would like to take over the counter (OTC) medication, Tylenol (acetaminophen) is ok.  However, some narcotic medications that are given for pain control contain acetaminophen as well. Therefore, you should not exceed more than 4000 mg of tylenol in a day if you do not have liver disease.  Also note that there are may OTC medicines, such as cold medicines and allergy medicines that my contain tylenol as well.   If you have any questions about medications and/or interactions please ask your doctor/PA or your pharmacist.   PAIN MEDICATION USE AND EXPECTATIONS  You have likely been given narcotic medications to help control your pain.  After a traumatic event that results in an fracture (broken bone) with or without surgery, it is ok to use narcotic pain medications to help control one's pain.  We understand that everyone responds to pain differently and each individual patient will be evaluated on a regular basis for the continued need for narcotic medications. Ideally, narcotic medication use should last no more than 6-8 weeks (coinciding with fracture healing).   As a patient it is your responsibility as well to monitor narcotic medication use and report the amount and frequency you use these medications when you come to your office visit.   We would also advise that if you are using narcotic medications, you should take a dose prior to therapy to maximize you participation.  IF YOU ARE ON NARCOTIC MEDICATIONS IT IS NOT PERMISSIBLE TO OPERATE A MOTOR VEHICLE (MOTORCYCLE/CAR/TRUCK/MOPED) OR HEAVY MACHINERY DO NOT MIX NARCOTICS WITH OTHER CNS (CENTRAL NERVOUS SYSTEM) DEPRESSANTS SUCH AS ALCOHOL       ICE AND ELEVATE INJURED/OPERATIVE EXTREMITY  Using ice and elevating the injured extremity above your heart can help with swelling and pain control.  Icing in a pulsatile fashion, such as 20 minutes on and 20 minutes off, can be followed.    Do not place ice directly on skin. Make sure there is a barrier between to skin and the ice pack.    Using frozen items such as frozen peas works well as the conform nicely to the are that  needs to be iced.  USE AN ACE WRAP OR TED HOSE FOR SWELLING CONTROL  In addition to icing and elevation, Ace wraps or TED hose are used to help limit and resolve swelling.  It is recommended to use Ace wraps or TED hose until you are informed to stop.    When using Ace Wraps start the  wrapping distally (farthest away from the body) and wrap proximally (closer to the body)   Example: If you had surgery on your leg or thing and you do not have a splint on, start the ace wrap at the toes and work your way up to the thigh        If you had surgery on your upper extremity and do not have a splint on, start the ace wrap at your fingers and work your way up to the upper arm  IF YOU ARE IN A SPLINT OR CAST DO NOT [place]   If your splint gets wet for any reason please contact the office immediately. You may shower in your splint or cast as long as you keep it dry.  This can be done by wrapping in a cast cover or garbage back (or similar)  Do Not stick any thing down your splint or cast such as pencils, money, or hangers to try and scratch yourself with.  If you feel itchy take benadryl as prescribed on the bottle for itching  IF YOU ARE IN A CAM BOOT (BLACK BOOT)  You may remove boot periodically. Perform daily dressing changes as noted below.  Wash the liner of the boot regularly and wear a sock when wearing the boot. It is recommended that you sleep in the boot until told otherwise  CALL THE OFFICE WITH ANY QUESTIONS OR CONCERTS: [phone]     Discharge Pin Site Instructions  Dress pins daily with Kerlix roll starting on POD 2. Wrap the Kerlix so that it tamps the skin down around the pin-skin interface to prevent/limit motion of the skin relative to the pin.  (Pin-skin motion is the primary cause of pain and infection related to external fixator pin sites).  Remove any crust or coagulum that may obstruct drainage with a saline moistened gauze or soap and water.  After POD 3, if there is no discernable drainage on the pin site dressing, the interval for change can by increased to every other day.  You may shower with the fixator, cleaning all pin sites gently with soap and water.  If you have a surgical wound this needs to be completely dry and without  drainage before showering.  The extremity can be lifted by the fixator to facilitate wound care and transfers.  Notify the office/Doctor if you experience increasing drainage, redness, or pain from a pin site, or if you notice purulent (thick, snot-like) drainage.  Discharge Wound Care Instructions  Do NOT apply any ointments, solutions or lotions to pin sites or surgical wounds.  These prevent needed drainage and even though solutions like hydrogen peroxide kill bacteria, they also damage cells lining the pin sites that help fight infection.  Applying lotions or ointments can keep the wounds moist and can cause them to breakdown and open up as well. This can increase the risk for infection. When in doubt call the office.  Surgical incisions should be dressed daily.  If any drainage is noted, use one layer of adaptic, then gauze, Kerlix, and an ace wrap.  Once the incision is completely dry  and without drainage, it may be left open to air out.  Showering may begin 36-48 hours later.  Cleaning gently with soap and water.  Traumatic wounds should be dressed daily as well.    One layer of adaptic, gauze, Kerlix, then ace wrap.  The adaptic can be discontinued once the draining has ceased    If you have a wet to dry dressing: wet the gauze with saline the squeeze as much saline out so the gauze is moist (not soaking wet), place moistened gauze over wound, then place a dry gauze over the moist one, followed by Kerlix wrap, then ace wrap.  Orthopaedic injuries and specific plan   R T-type acetabulum fx with protrusion of quadrilateral surface status post ORIF  TDWB x 8 weeks  posterior hip precautions  PT/OT  Ice as needed  TED hose  Dressing changes PRN  Ok to shower with assist  ______________________________________________________  Information on my medicine - Coumadin   (Warfarin)  This medication education was reviewed with me or my healthcare representative as part of my  discharge preparation.  The pharmacist that spoke with me during my hospital stay was:  Arty Baumgartner, [facility]  Why was Coumadin prescribed for you? Coumadin was prescribed for you because you have a blood clot or a medical condition that can cause an increased risk of forming blood clots. Blood clots can cause serious health problems by blocking the flow of blood to the heart, lung, or brain. Coumadin can prevent harmful blood clots from forming. As a reminder your indication for Coumadin is:   Blood Clot Prevention After Orthopedic Surgery  What test will check on my response to Coumadin? While on Coumadin (warfarin) you will need to have an INR test regularly to ensure that your dose is keeping you in the desired range. The INR (international normalized ratio) number is calculated from the result of the laboratory test called prothrombin time (PT).  If an INR APPOINTMENT HAS NOT ALREADY BEEN MADE FOR YOU please schedule an appointment to have this lab work done by your health care provider within 7 days. Your INR goal is usually a number between:  2 to 3 or your provider may give you a more narrow range like 2-2.5.  Ask your health care provider during an office visit what your goal INR is.  What  do you need to  know  About  COUMADIN? Take Coumadin (warfarin) exactly as prescribed by your healthcare provider about the same time each day.  DO NOT stop taking without talking to the doctor who prescribed the medication.  Stopping without other blood clot prevention medication to take the place of Coumadin may increase your risk of developing a new clot or stroke.  Get refills before you run out.  What do you do if you miss a dose? If you miss a dose, take it as soon as you remember on the same day then continue your regularly scheduled regimen the next day.  Do not take two doses of Coumadin at the same time.  Important Safety Information A possible side effect of Coumadin (Warfarin) is an  increased risk of bleeding. You should call your healthcare provider right away if you experience any of the following:   Bleeding from an injury or your nose that does not stop.   Unusual colored urine (red or dark brown) or unusual colored stools (red or black).   Unusual bruising for unknown reasons.   A serious fall or if  you hit your head (even if there is no bleeding).  Some foods or medicines interact with Coumadin (warfarin) and might alter your response to warfarin. To help avoid this:   Eat a balanced diet, maintaining a consistent amount of Vitamin K.   Notify your provider about major diet changes you plan to make.   Avoid alcohol or limit your intake to 1 drink for women and 2 drinks for men per day. (1 drink is 5 oz. wine, 12 oz. beer, or 1.5 oz. liquor.)  Make sure that ANY health care provider who prescribes medication for you knows that you are taking Coumadin (warfarin).  Also make sure the healthcare provider who is monitoring your Coumadin knows when you have started a new medication including herbals and non-prescription products.  Coumadin (Warfarin)  Major Drug Interactions  Increased Warfarin Effect Decreased Warfarin Effect  Alcohol (large quantities) Antibiotics (esp. Septra/Bactrim, Flagyl, Cipro) Amiodarone (Cordarone) Aspirin (ASA) Cimetidine (Tagamet) Megestrol (Megace) NSAIDs (ibuprofen, naproxen, etc.) Piroxicam (Feldene) Propafenone (Rythmol SR) Propranolol (Inderal) Isoniazid (INH) Posaconazole (Noxafil) Barbiturates (Phenobarbital) Carbamazepine (Tegretol) Chlordiazepoxide (Librium) Cholestyramine (Questran) Griseofulvin Oral Contraceptives Rifampin Sucralfate (Carafate) Vitamin K   Coumadin (Warfarin) Major Herbal Interactions  Increased Warfarin Effect Decreased Warfarin Effect  Garlic Ginseng Ginkgo biloba Coenzyme Q10 Green tea [place]. Johns wort    Coumadin (Warfarin) FOOD Interactions  Eat a consistent number of servings per week  of foods HIGH in Vitamin K (1 serving =  cup)  Collards (cooked, or boiled & drained) Kale (cooked, or boiled & drained) Mustard greens (cooked, or boiled & drained) Parsley *serving size only =  cup Spinach (cooked, or boiled & drained) Swiss chard (cooked, or boiled & drained) Turnip greens (cooked, or boiled & drained)  Eat a consistent number of servings per week of foods MEDIUM-HIGH in Vitamin K (1 serving = 1 cup)  Asparagus (cooked, or boiled & drained) Broccoli (cooked, boiled & drained, or raw & chopped) Brussel sprouts (cooked, or boiled & drained) *serving size only =  cup Lettuce, raw (green leaf, endive, romaine) Spinach, raw Turnip greens, raw & chopped   These websites have more information on Coumadin (warfarin):  FailFactory.se; VeganReport.com.au;

## 2014-01-17 NOTE — Progress Notes (Signed)
Rehab admissions - I met with pt and his sister in follow up with rehab MD consult to explain the possibility of inpatient rehab. Pt is planning to go home with support from his sister and daughter. Both women work but they will be alternating their schedules to accommodate his care needs. Pt denied need for further rehab. He and sister did ask about the possibility of applying for Medicaid and I told them I would update Susan with case management.  I will now sign off pt's case in light of pt/family preference to return home. I updated Susan with case management. Please call me with any questions. Thanks.   Janine Turnington, PT Rehabilitation Admissions Coordinator [phone]  

## 2014-01-17 NOTE — Progress Notes (Signed)
Chart and note reviewed.  Kimberly Harris, RD, LDN, CNSC Pager [phone] After Hours Pager [phone]  

## 2014-01-17 NOTE — Progress Notes (Signed)
Orthopaedic Trauma Service Progress Note  Subjective  Doing better this am Reviewed nursing notes from evening and overnight Belly feels much better Wants to eat  No nausea or vomiting   + BM x 2 + flatus   Review of Systems  Constitutional: Negative for fever and chills.  Respiratory: Negative for shortness of breath and wheezing.   Cardiovascular: Negative for chest pain and palpitations.  Gastrointestinal: Negative for nausea and vomiting.  Genitourinary:       Foley  Musculoskeletal:       R hip pain   Neurological: Negative for tingling and sensory change.     Objective   BP 117/60  Pulse 83  Temp(Src) 99.8 F (37.7 C) (Oral)  Resp 14  Wt 78.472 kg (173 lb)  SpO2 96%  Intake/Output     08/26 0701 - 08/27 0700 08/27 0701 - 08/28 0700   P.O. 515    I.V. (mL/kg) 2187.9 (27.9)    Blood     IV Piggyback     Total Intake(mL/kg) 2702.9 (34.4)    Urine (mL/kg/hr) 4500 (2.4)    Blood     Total Output 4500     Net -1797.1            Labs Results for Michael Fields, Michael Fields (MRN [id number]) as of 01/17/2014 08:55  Ref. Range 01/17/2014 06:27  Sodium Latest Range: 137-147 mEq/L 138  Potassium Latest Range: 3.7-5.3 mEq/L 4.1  Chloride Latest Range: 96-112 mEq/L 103  CO2 Latest Range: 19-32 mEq/L 25  BUN Latest Range: 6-23 mg/dL 7  Creatinine Latest Range: 0.50-1.35 mg/dL 0.83  Calcium Latest Range: 8.4-10.5 mg/dL 8.7  GFR calc non Af Amer Latest Range: >90 mL/min >90  GFR calc Af Amer Latest Range: >90 mL/min >90  Glucose Latest Range: 70-99 mg/dL 116 (H)  Anion gap Latest Range: 5-15  10  WBC Latest Range: 4.0-10.5 K/uL 7.6  RBC Latest Range: 4.22-5.81 MIL/uL 3.06 (L)  Hemoglobin Latest Range: 13.0-17.0 g/dL 9.7 (L)  HCT Latest Range: 39.0-52.0 % 27.6 (L)  MCV Latest Range: 78.0-100.0 fL 90.2  MCH Latest Range: 26.0-34.0 pg 31.7  MCHC Latest Range: 30.0-36.0 g/dL 35.1  RDW Latest Range: 11.5-15.5 % 13.6  Platelets Latest Range: 150-400 K/uL 104 (L)   Prothrombin Time Latest Range: 11.6-15.2 seconds 19.9 (H)  INR Latest Range: 0.00-1.49  1.69 (H)    Exam   Gen: awake and alert, NAD, appears very comfortable this am  Lungs: clear anterior fields Cardiac: RRR, s1 and s2 Abd: very soft, NTND, excellent bowel sounds Pelvis: incision looks great Ext:       Right lower extremity   Ted hose in place  Swelling controlled  Ext warm   + DP pulse  No dct   Compartments soft and NT  DPN, SPN, TN sensation intact  EHL, FHL, AT, PT, peroneals, gastroc motor intact     Assessment and Plan   POD/HD#: 2   63 y/o male s/p fall down embankment with complex R acetabulum fracture  1. Fall  2. R T-type acetabulum fx with protrusion of quadrilateral surface status post ORIF                            TDWB x 8 weeks              posterior hip precautions             PT/OT  Ice as needed             TED hose             Dressing changes PRN  Ok to shower with assist  3. EtOH dependence             CIWA protocol  4. Nicotine dependence             No nicotine products as these delay bone and wound healing  5. Pain management:             Percocet 5/325 1-2 q6h prn             Oxy ir 5mg  1-2 q3h prn             Robaxin 1000 mg q6h             Dilaudid IV for severe breakthrough pain              6. ABL anemia/Hemodynamics             Stable             Received 2 units PRBCs in OR               Cbc in am                7. DVT/PE prophylaxis:             lovenox bridge to coumadin   INR trending up   8. ID:               periop ancef  9. Activity:             TDWB R leg             PT/OT consults             Posterior hip precautions R hip   10. FEN/Foley/Lines:             Ileus   Appears improved   Continue with scheduled reglan   Advance to full liquids today    Dc foley   Continue with IVF    11. Impediments to fracture healing:             Nicotine dependence             EtOH abuse  12.  Dispo            therapies today             Pt has no place to go after hospital stay, may be able to stay with daughter, this needs to be confirmed               Will likely need short term snf             Will get CIR consult as well     Hopeful for dc tomorrow vs Saturday     Jari Pigg, PA-C [facility] [phone] (P) 01/17/2014 8:53 AM  **Disclaimer: This note may have been dictated with voice recognition software. Similar sounding words can inadvertently be transcribed and this note may contain transcription errors which may not have been corrected upon publication of note.**

## 2014-01-17 NOTE — Progress Notes (Signed)
ANTICOAGULATION CONSULT NOTE - Follow Up Consult  Pharmacy Consult for Coumadin Indication: VTE prophylaxis  No Known Allergies  Patient Measurements: Height: 6' (182.9 cm) Weight: 173 lb (78.472 kg) IBW/kg (Calculated) : 77.6  Vital Signs: Temp: 98.5 F (36.9 C) (08/27 1437) Temp src: Oral (08/27 1437) BP: 105/60 mmHg (08/27 1437) Pulse Rate: 73 (08/27 1437)  Labs:  Recent Labs  01/15/14 0827 01/15/14 0850  01/15/14 1652 01/16/14 0517 01/17/14 0627  HGB  --  11.7*  < > 10.2* 10.3* 9.7*  HCT  --  34.3*  < > 30.0* 29.2* 27.6*  PLT  --  121*  --   --  104* 104*  APTT  --  30  --   --   --   --   LABPROT  --  13.7  --   --  15.2 19.9*  INR  --  1.05  --   --  1.20 1.69*  CREATININE 0.81  --   --   --  0.87 0.83  < > = values in this interval not displayed.  Estimated Creatinine Clearance: 98.7 ml/min (by C-G formula based on Cr of 0.83).  Assessment:   INR up to 1.69 after Coumadin 7.5 mg daily x 2 days.  CBC has trended down some. Low baseline platelet count of 121, now 104 x  2 days.  Has TED hose, but not on Lovenox.  Goal of Therapy:  INR 2-3 Monitor platelets by anticoagulation protocol: Yes   Plan:   Coumadin 5 mg today.  Daily PT/INR.  Follow up CBC.  Coumadin education done, including monitoring, precautions, potential drug-drug, drug-food and drug-alcohol interactions.   Arty Baumgartner, [place] Pager: [phone] 01/17/2014,3:59 PM

## 2014-01-17 NOTE — Progress Notes (Signed)
Physical Therapy Treatment Patient Details Name: MANDEEP KISER MRN: [id number] DOB: 1951/05/23 Today's Date: 01/17/2014    History of Present Illness 63 y.o. male Rt T-type acetabulum fx with protrusion of quadrilateral surface status post ORIF    PT Comments    Patient appears to be progressing well and tolerating therapy much better today. Able to incorporate some therex this session. Continue to recommend CIR for functional independence and to decreased burden of care on caregivers prior to DC home.     Follow Up Recommendations  CIR     Equipment Recommendations  Rolling walker with 5" wheels;3in1 (PT);Other (comment)    Recommendations for Other Services       Precautions / Restrictions Precautions Precautions: Posterior Hip Precaution Comments: Reviewed posterior hip precautions Restrictions Weight Bearing Restrictions: Yes RLE Weight Bearing: Touchdown weight bearing    Mobility  Bed Mobility   Bed Mobility: Supine to Sit;Sit to Supine     Supine to sit: Min guard     General bed mobility comments: patient able to positoning LE out of bed without assitance. Cues for technique  Transfers Overall transfer level: Needs assistance Equipment used: Rolling walker (2 wheeled)   Sit to Stand: Min assist;+2 physical assistance         General transfer comment: Min A for safety and to ensure balance. A to ensure TWB of R LE with stand. Cues for proper position with sit to stand and to review precuations  Ambulation/Gait Ambulation/Gait assistance: Min assist;+2 safety/equipment Ambulation Distance (Feet): 10 Feet Assistive device: Rolling walker (2 wheeled) Gait Pattern/deviations: Step-to pattern     General Gait Details: Patient able to hop with A for balance and to ensure TWB. Cues for positoning and use of RW.    Stairs            Wheelchair Mobility    Modified Rankin (Stroke Patients Only)       Balance                                     Cognition Arousal/Alertness: Awake/alert Behavior During Therapy: WFL for tasks assessed/performed Overall Cognitive Status: Within Functional Limits for tasks assessed       Memory: Decreased short-term memory (Patient does not recall therapy yesterday)              Exercises General Exercises - Lower Extremity Quad Sets: Strengthening;10 reps;Seated;Both Gluteal Sets: Strengthening;Both;10 reps;Seated Heel Slides: AAROM;Strengthening;Right;10 reps    General Comments        Pertinent Vitals/Pain Pain Location: Rt buttock area Pain Intervention(s): Limited activity within patient's tolerance;Repositioned    Home Living                      Prior Function            PT Goals (current goals can now be found in the care plan section) Progress towards PT goals: Progressing toward goals    Frequency  Min 5X/week    PT Plan Current plan remains appropriate    Co-evaluation             End of Session Equipment Utilized During Treatment: Gait belt Activity Tolerance: Patient tolerated treatment well Patient left: in chair;with call bell/phone within reach     Time: 1010-1029 PT Time Calculation (min): 19 min  Charges:  $Gait Training: 8-22 mins  G Codes:      Jacqualyn Posey 01/17/2014, 11:10 AM 01/17/2014 Jacqualyn Posey PTA [phone] pager [phone] office

## 2014-01-17 NOTE — Progress Notes (Signed)
Once STAT KUB completed, Kirstin Shepperson PA results consistent with a mild ileus. Telephone orders received, placed, and implemented to make pt NPO, increase IV fluids to 100 cc/hr, as well scheduling reglan q6h. PA also notified RN to go ahead and give second dulcolax suppository. Pt has attempted BMx2 on bedside commode. Two small BM's have occurred and pt is passing gas. Bowel sounds are active, no abdominal distention present, and no pain or guarding upon inspection. Nursing will continue to monitor.

## 2014-01-18 LAB — TYPE AND SCREEN
ABO/RH(D): A POS
ANTIBODY SCREEN: NEGATIVE
UNIT DIVISION: 0
Unit division: 0
Unit division: 0
Unit division: 0

## 2014-01-18 LAB — BASIC METABOLIC PANEL
ANION GAP: 11 (ref 5–15)
BUN: 9 mg/dL (ref 6–23)
CHLORIDE: 104 meq/L (ref 96–112)
CO2: 24 mEq/L (ref 19–32)
CREATININE: 0.81 mg/dL (ref 0.50–1.35)
Calcium: 8.7 mg/dL (ref 8.4–10.5)
GFR calc non Af Amer: >90 mL/min (ref >90)
Glucose, Bld: 106 mg/dL — ABNORMAL HIGH (ref 70–99)
POTASSIUM: 4.3 meq/L (ref 3.7–5.3)
Sodium: 139 mEq/L (ref 137–147)

## 2014-01-18 LAB — CBC
HCT: 28.5 % — ABNORMAL LOW (ref 39.0–52.0)
Hemoglobin: 9.9 g/dL — ABNORMAL LOW (ref 13.0–17.0)
MCH: 31.5 pg (ref 26.0–34.0)
MCHC: 34.7 g/dL (ref 30.0–36.0)
MCV: 90.8 fL (ref 78.0–100.0)
PLATELETS: 133 10*3/uL — AB (ref 150–400)
RBC: 3.14 MIL/uL — ABNORMAL LOW (ref 4.22–5.81)
RDW: 13.5 % (ref 11.5–15.5)
WBC: 6 10*3/uL (ref 4.0–10.5)

## 2014-01-18 LAB — PROTIME-INR
INR: 2.04 — ABNORMAL HIGH (ref 0.00–1.49)
PROTHROMBIN TIME: 23 s — AB (ref 11.6–15.2)

## 2014-01-18 MED ORDER — WARFARIN SODIUM 5 MG PO TABS: 5.0000 mg | ORAL_TABLET | Freq: Every day | ORAL | Status: DC

## 2014-01-18 NOTE — Progress Notes (Signed)
Occupational Therapy Treatment Patient Details Name: Michael [name] MRN: [id number] DOB: [birth year] Today's Date: 01/18/2014    History of present illness 63 y.o. male Rt T-type acetabulum fx with protrusion of quadrilateral surface status post ORIF   OT comments  Pt seen today for ADL session. Pt requested to shower and assisted for UB/LB dressing and bathing. Pt reports he has family at home to assist with RLE bathing and dressing. VC's for precautions. Education and training completed for fall prevention and compensatory techniques for LB ADLs.  Follow Up Recommendations  No OT follow up;Supervision/Assistance - 24 hour    Equipment Recommendations  3 in 1 bedside comode    Recommendations for Other Services      Precautions / Restrictions Precautions Precautions: Posterior Hip Precaution Comments: Reviewed posterior hip precautions Restrictions Weight Bearing Restrictions: Yes RLE Weight Bearing: Touchdown weight bearing       Mobility Bed Mobility               General bed mobility comments: Pt returning from bathroom when OT arrived.  Transfers Overall transfer level: Needs assistance Equipment used: Rolling walker (2 wheeled) Transfers: Sit to/from Stand Sit to Stand: Supervision         General transfer comment: Supervision for safety and precautions.         ADL Overall ADL's : Needs assistance/impaired     Grooming: Standing;Supervision/safety   Upper Body Bathing: Set up;Sitting   Lower Body Bathing: Minimal assistance;Sit to/from stand Lower Body Bathing Details (indicate cue type and reason): (A) for RLE Upper Body Dressing : Set up;Sitting   Lower Body Dressing: Sit to/from stand;Minimal assistance Lower Body Dressing Details (indicate cue type and reason): (A) for RLE Toilet Transfer: Supervision/safety;Ambulation;RW;BSC           Functional mobility during ADLs: Supervision/safety;Rolling walker General ADL Comments: Pt at  supervision level for transfers and functional mobility. Pt requested to shower and assisted pt into bathroom and setup for shower. Pt required (A) for RLE bathing and dressing and VC's for precautions. Pt donned loose shorts with supervision for precautions and educated on compensatory technique of dressing RLE first.                 Cognition  Arousal/Alertness: Awake/Alert Behavior During Therapy: WFL for tasks assessed/performed Overall Cognitive Status: Within Functional Limits for tasks assessed                         Exercises Other Exercises Other Exercises: encouraged pt to continue UE exercises.            Pertinent Vitals/ Pain       Pain Assessment: No/denies pain         Frequency Min 2X/week     Progress Toward Goals  OT Goals(current goals can now be found in the care plan section)  Progress towards OT goals: Progressing toward goals  Acute Rehab OT Goals Patient Stated Goal: home OT Goal Formulation: With patient Time For Goal Achievement: 01/23/14 Potential to Achieve Goals: Good ADL Goals Pt Will Perform Lower Body Bathing: with supervision;with adaptive equipment;sit to/from stand Pt Will Perform Lower Body Dressing: with supervision;with adaptive equipment;sit to/from stand Pt/caregiver will Perform Home Exercise Program: Increased strength;Both right and left upper extremity;With theraband;With written HEP provided;Independently  Plan Discharge plan needs to be updated       End of Session Equipment Utilized During Treatment: Rolling walker   Activity Tolerance Patient tolerated treatment  well   Patient Left in chair;with call bell/phone within reach;with family/visitor present           Time: 1027-1050 OT Time Calculation (min): 23 min  Charges: OT General Charges $OT Visit: 1 Procedure OT Treatments $Self Care/Home Management : 23-37 mins  Juluis Rainier [phone] 01/18/2014, 11:00 AM

## 2014-01-18 NOTE — Progress Notes (Signed)
Physical Therapy Treatment Patient Details Name: Michael [name] MRN: [id number] DOB: 1950/12/15 Today's Date: 01/18/2014    History of Present Illness 63 y.o. male Rt T-type acetabulum fx with protrusion of quadrilateral surface status post ORIF    PT Comments    Patient ambulating very well today and able to complete stair training as he is now planning to DC home. Added HHPT and stair training to todays session for safety and practice  Follow Up Recommendations  Home health PT;Supervision/Assistance - 24 hour     Equipment Recommendations  Rolling walker with 5" wheels;3in1 (PT);Other (comment)    Recommendations for Other Services       Precautions / Restrictions Precautions Precautions: Posterior Hip Precaution Comments: Reviewed posterior hip precautions Restrictions RLE Weight Bearing: Touchdown weight bearing    Mobility  Bed Mobility                  Transfers Overall transfer level: Needs assistance Equipment used: Rolling walker (2 wheeled)   Sit to Stand: Supervision         General transfer comment: Supervision for safety and precautions.   Ambulation/Gait Ambulation/Gait assistance: Min guard Ambulation Distance (Feet): 90 Feet Assistive device: Rolling walker (2 wheeled) Gait Pattern/deviations: Step-to pattern     General Gait Details: Patient able to hop with A for balance and to ensure TWB. Cues for positoning and use of RW.    Stairs Stairs: Yes Stairs assistance: Min guard Stair Management: Step to pattern;Backwards;With walker;No rails Number of Stairs: 2 General stair comments: Patient practiced two steps twice with cues for sequence  Wheelchair Mobility    Modified Rankin (Stroke Patients Only)       Balance                                    Cognition Arousal/Alertness: Awake/alert Behavior During Therapy: WFL for tasks assessed/performed Overall Cognitive Status: Within Functional Limits for tasks  assessed                      Exercises General Exercises - Lower Extremity Quad Sets: Strengthening;10 reps;Seated;Both Long Arc Quad: AROM;Right;10 reps Heel Slides: AAROM;Strengthening;Right;10 reps Hip ABduction/ADduction: AAROM;Right;10 reps Straight Leg Raises: Right;AAROM;Supine;10 reps    General Comments        Pertinent Vitals/Pain      Home Living                      Prior Function            PT Goals (current goals can now be found in the care plan section) Progress towards PT goals: Progressing toward goals    Frequency  Min 5X/week    PT Plan Discharge plan needs to be updated    Co-evaluation             End of Session Equipment Utilized During Treatment: Gait belt Activity Tolerance: Patient tolerated treatment well Patient left: in chair;with call bell/phone within reach     Time: 1051-1115 PT Time Calculation (min): 24 min  Charges:  $Gait Training: 8-22 mins $Therapeutic Exercise: 8-22 mins                    G Codes:      Jacqualyn Posey 01/18/2014, 2:17 PM 01/18/2014 Jacqualyn Posey PTA [phone] pager [phone] office

## 2014-01-18 NOTE — Progress Notes (Signed)
I have seen and examined the patient. I agree with the findings above.  Rozanna Box, MD 01/18/2014 7:59 AM

## 2014-01-18 NOTE — Progress Notes (Signed)
[facility] Progress Note  Subjective  Doing great  Ready to go home + BM, + flatus Voiding well w/o foley  R hip feels great, pain well controlled   Review of Systems  Constitutional: Negative for fever and chills.  Eyes: Negative for blurred vision and double vision.  Respiratory: Negative for shortness of breath and wheezing.   Cardiovascular: Negative for chest pain and palpitations.  Gastrointestinal: Negative for nausea, vomiting and abdominal pain.  Genitourinary: Negative for dysuria.  Neurological: Negative for tingling, sensory change and headaches.     Objective   BP 117/62  Pulse 69  Temp(Src) 99 F (37.2 C) (Oral)  Resp 16  Ht 6' (1.829 m)  Wt 78.472 kg (173 lb)  SpO2 96%  Intake/Output     08/27 0701 - 08/28 0700 08/28 0701 - 08/29 0700   P.O. 800    I.V. (mL/kg) 600 (7.6)    Total Intake(mL/kg) 1400 (17.8)    Urine (mL/kg/hr) 725 (0.4)    Total Output 725     Net +675          Urine Occurrence 1 x      Labs Results for Michael [name], Michael [name] (MRN [id number]) as of 01/18/2014 08:11  Ref. Range 01/18/2014 05:18  Sodium Latest Range: 137-147 mEq/L 139  Potassium Latest Range: 3.7-5.3 mEq/L 4.3  Chloride Latest Range: 96-112 mEq/L 104  CO2 Latest Range: 19-32 mEq/L 24  BUN Latest Range: 6-23 mg/dL 9  Creatinine Latest Range: 0.50-1.35 mg/dL 0.81  Calcium Latest Range: 8.4-10.5 mg/dL 8.7  GFR calc non Af Amer Latest Range: >90 mL/min >90  GFR calc Af Amer Latest Range: >90 mL/min >90  Glucose Latest Range: 70-99 mg/dL 106 (H)  Anion gap Latest Range: 5-15  11  WBC Latest Range: 4.0-10.5 K/uL 6.0  RBC Latest Range: 4.22-5.81 MIL/uL 3.14 (L)  Hemoglobin Latest Range: 13.0-17.0 g/dL 9.9 (L)  HCT Latest Range: 39.0-52.0 % 28.5 (L)  MCV Latest Range: 78.0-100.0 fL 90.8  MCH Latest Range: 26.0-34.0 pg 31.5  MCHC Latest Range: 30.0-36.0 g/dL 34.7  RDW Latest Range: 11.5-15.5 % 13.5  Platelets Latest Range: 150-400 K/uL 133 (L)  Prothrombin  Time Latest Range: 11.6-15.2 seconds 23.0 (H)  INR Latest Range: 0.00-1.49  2.04 (H)    Exam  Gen: sitting in bedside chair awake and alert, NAD Lungs: clear B  Cardiac: RRR, S1 and S2 Abd: + BS, NTND, soft Pelvis: incision looks great  Ext:       Right lower extremity               Ted hose in place             Swelling controlled             Ext warm               + DP pulse             No dct               Compartments soft and NT             DPN, SPN, TN sensation intact             EHL, FHL, AT, PT, peroneals, gastroc motor intact   Assessment and Plan   POD/HD#: 9   63 y/o male s/p fall down embankment with complex R acetabulum fracture  1. Fall  2. R T-type acetabulum fx with protrusion of quadrilateral surface  status post ORIF                            TDWB x 8 weeks              posterior hip precautions             PT/OT               Ice as needed             TED hose             Dressing changes PRN             Ok to shower with assist  3. EtOH dependence             CIWA protocol  Stable   No signs of withdrawal   4. Nicotine dependence             No nicotine products as these delay bone and wound healing  5. Pain management:             Percocet 5/325 1-2 q6h prn             Oxy ir 5mg  1-2 q3h prn             Robaxin 1000 mg q6h             Dilaudid IV for severe breakthrough pain              6. ABL anemia/Hemodynamics             Stable             Received 2 units PRBCs in OR                              7. DVT/PE prophylaxis:             lovenox bridge to coumadin               INR trending up   Dc home on just coumadin   8. ID:               periop ancef completed   9. Activity:             TDWB R leg             HHPT             Posterior hip precautions R hip   10. FEN/Foley/Lines:             Ileus                         improved                         regular diet for lunch             dc iv and ivf     11.  Impediments to fracture healing:             Nicotine dependence             EtOH abuse  12. Dispo            dc home today after lunch   Follow up with orho in 10-14 days  Coumadin for dvt/pe prophylaxis x 8  weeks      Jari Pigg, PA-C [facility] [phone] (P) 01/18/2014 8:10 AM  **Disclaimer: This note may have been dictated with voice recognition software. Similar sounding words can inadvertently be transcribed and this note may contain transcription errors which may not have been corrected upon publication of note.**

## 2014-01-18 NOTE — Discharge Summary (Signed)
[facility] (OTS)  Patient ID: Michael [name] MRN: [id number] DOB/AGE: 11-25-1950 63 y.o.  Admit date: 01/14/2014 Discharge date: 01/18/2014  full discharge summary dictated #[id number]  Admission Diagnoses: Fall Right T-type acetabular fracture Nicotine dependence Alcohol abuse  Discharge Diagnoses:  Principal Problem:   Right T-type acetabulum fracture, closed Active Problems:   Nicotine dependence   Alcohol abuse   Fall   Procedures Performed: 01/15/2014- Dr. Marcelino [name] Open reduction and internal fixation of right T-type acetabulum involving both columns through an anterior Stoppa approach   Discharged Condition: good  Hospital Course: see dictation   Consults: None  Significant Diagnostic Studies: labs:   Results for Michael, [name] (MRN [id number]) as of 01/18/2014 09:06  Ref. Range 01/18/2014 05:18  Sodium Latest Range: 137-147 mEq/L 139  Potassium Latest Range: 3.7-5.3 mEq/L 4.3  Chloride Latest Range: 96-112 mEq/L 104  CO2 Latest Range: 19-32 mEq/L 24  BUN Latest Range: 6-23 mg/dL 9  Creatinine Latest Range: 0.50-1.35 mg/dL 0.81  Calcium Latest Range: 8.4-10.5 mg/dL 8.7  GFR calc non Af Amer Latest Range: >90 mL/min >90  GFR calc Af Amer Latest Range: >90 mL/min >90  Glucose Latest Range: 70-99 mg/dL 106 (H)  Anion gap Latest Range: 5-15  11  WBC Latest Range: 4.0-10.5 K/uL 6.0  RBC Latest Range: 4.22-5.81 MIL/uL 3.14 (L)  Hemoglobin Latest Range: 13.0-17.0 g/dL 9.9 (L)  HCT Latest Range: 39.0-52.0 % 28.5 (L)  MCV Latest Range: 78.0-100.0 fL 90.8  MCH Latest Range: 26.0-34.0 pg 31.5  MCHC Latest Range: 30.0-36.0 g/dL 34.7  RDW Latest Range: 11.5-15.5 % 13.5  Platelets Latest Range: 150-400 K/uL 133 (L)  Prothrombin Time Latest Range: 11.6-15.2 seconds 23.0 (H)  INR Latest Range: 0.00-1.49  2.04 (H)    Treatments: IV hydration, antibiotics: Ancef, analgesia: Dilaudid, Percocet, OxyIR, anticoagulation: LMW heparin and coumadin therapies:  PT, OT and RN, procedures: Blood transfusion, 2 units packed red cells in OR and surgery: As above  Discharge Exam:  Orthopaedic Trauma Service Progress Note  Subjective  Doing great   Ready to go home + BM, + flatus Voiding well w/o foley   R hip feels great, pain well controlled   Review of Systems  Constitutional: Negative for fever and chills.  Eyes: Negative for blurred vision and double vision.  Respiratory: Negative for shortness of breath and wheezing.   Cardiovascular: Negative for chest pain and palpitations.  Gastrointestinal: Negative for nausea, vomiting and abdominal pain.  Genitourinary: Negative for dysuria.  Neurological: Negative for tingling, sensory change and headaches.     Objective   BP 117/62  Pulse 69  Temp(Src) 99 F (37.2 C) (Oral)  Resp 16  Ht 6' (1.829 m)  Wt 78.472 kg (173 lb)  SpO2 96%  Intake/Output     08/27 0701 - 08/28 0700 08/28 0701 - 08/29 0700    P.O. 800     I.V. (mL/kg) 600 (7.6)     Total Intake(mL/kg) 1400 (17.8)     Urine (mL/kg/hr) 725 (0.4)     Total Output 725      Net +675            Urine Occurrence 1 x       Labs Results for Michael, [name] (MRN [id number]) as of 01/18/2014 08:11   Ref. Range  01/18/2014 05:18   Sodium  Latest Range: 137-147 mEq/L  139   Potassium  Latest Range: 3.7-5.3 mEq/L  4.3   Chloride  Latest Range: 96-112  mEq/L  104   CO2  Latest Range: 19-32 mEq/L  24   BUN  Latest Range: 6-23 mg/dL  9   Creatinine  Latest Range: 0.50-1.35 mg/dL  0.81   Calcium  Latest Range: 8.4-10.5 mg/dL  8.7   GFR calc non Af Amer  Latest Range: >90 mL/min  >90   GFR calc Af Amer  Latest Range: >90 mL/min  >90   Glucose  Latest Range: 70-99 mg/dL  106 (H)   Anion gap  Latest Range: 5-15   11   WBC  Latest Range: 4.0-10.5 K/uL  6.0   RBC  Latest Range: 4.22-5.81 MIL/uL  3.14 (L)   Hemoglobin  Latest Range: 13.0-17.0 g/dL  9.9 (L)   HCT  Latest Range: 39.0-52.0 %  28.5 (L)   MCV  Latest Range: 78.0-100.0 fL   90.8   MCH  Latest Range: 26.0-34.0 pg  31.5   MCHC  Latest Range: 30.0-36.0 g/dL  34.7   RDW  Latest Range: 11.5-15.5 %  13.5   Platelets  Latest Range: 150-400 K/uL  133 (L)   Prothrombin Time  Latest Range: 11.6-15.2 seconds  23.0 (H)   INR  Latest Range: 0.00-1.49   2.04 (H)     Exam  Gen: sitting in bedside chair awake and alert, NAD Lungs: clear B   Cardiac: RRR, S1 and S2 Abd: + BS, NTND, soft Pelvis: incision looks great   Ext:        Right lower extremity               Ted hose in place             Swelling controlled             Ext warm               + DP pulse             No dct               Compartments soft and NT             DPN, SPN, TN sensation intact             EHL, FHL, AT, PT, peroneals, gastroc motor intact   Assessment and Plan   POD/HD#: 33   63 y/o male s/p fall down embankment with complex R acetabulum fracture  1. Fall  2. R T-type acetabulum fx with protrusion of quadrilateral surface status post ORIF                            TDWB x 8 weeks              posterior hip precautions             PT/OT               Ice as needed             TED hose             Dressing changes PRN             Ok to shower with assist  3. EtOH dependence             CIWA protocol             Stable               No signs of  withdrawal   4. Nicotine dependence             No nicotine products as these delay bone and wound healing  5. Pain management:             Percocet 5/325 1-2 q6h prn             Oxy ir 5mg  1-2 q3h prn             Robaxin 1000 mg q6h             Dilaudid IV for severe breakthrough pain              6. ABL anemia/Hemodynamics             Stable             Received 2 units PRBCs in OR                              7. DVT/PE prophylaxis:             lovenox bridge to coumadin               INR trending up               Dc home on just coumadin   8. ID:               periop ancef completed   9. Activity:              TDWB R leg             HHPT             Posterior hip precautions R hip   10. FEN/Foley/Lines:             Ileus                         improved                         regular diet for lunch             dc iv and ivf     11. Impediments to fracture healing:             Nicotine dependence             EtOH abuse  12. Dispo            dc home today after lunch               Follow up with ortho in 10-14 days             Coumadin for dvt/pe prophylaxis x 8 weeks      Jari Pigg, PA-C [facility] [phone] (P) 01/18/2014 8:10 AM  **Disclaimer: This note may have been dictated with voice recognition software. Similar sounding words can inadvertently be transcribed and this note may contain transcription errors which may not have been corrected upon publication of note.**      Disposition: 01-Home or Self Care      Discharge Instructions   Call MD / Call 911    Complete by:  As directed   If you experience chest pain or shortness of breath, CALL 911 and be transported to the hospital emergency room.  If you develope a fever above 101 F,  pus (white drainage) or increased drainage or redness at the wound, or calf pain, call your surgeon's office.     Constipation Prevention    Complete by:  As directed   Drink plenty of fluids.  Prune juice may be helpful.  You may use a stool softener, such as Colace (over the counter) 100 mg twice a day.  Use MiraLax (over the counter) for constipation as needed.     Diet general    Complete by:  As directed      Discharge instructions    Complete by:  As directed   Orthopaedic Trauma Service Discharge Instructions   General Discharge Instructions  WEIGHT BEARING STATUS: Touchdown weightbearing right leg  RANGE OF MOTION/ACTIVITY: Posterior hip precautions right hip, unrestricted range of motion right ankle and knee  Wound care: Daily dry dressing changes to right hip. Okay to shower. Can leave wound open to air  once dry. See more detailed instruction below  Diet: as you were eating previously.  Can use over the counter stool softeners and bowel preparations, such as Miralax, to help with bowel movements.  Narcotics can be constipating.  Be sure to drink plenty of fluids  STOP SMOKING OR USING NICOTINE PRODUCTS!!!!  As discussed nicotine severely impairs your body's ability to heal surgical and traumatic wounds but also impairs bone healing.  Wounds and bone heal by forming microscopic blood vessels (angiogenesis) and nicotine is a vasoconstrictor (essentially, shrinks blood vessels).  Therefore, if vasoconstriction occurs to these microscopic blood vessels they essentially disappear and are unable to deliver necessary nutrients to the healing tissue.  This is one modifiable factor that you can do to dramatically increase your chances of healing your injury.    (This means no smoking, no nicotine gum, patches, etc)  DO NOT USE NONSTEROIDAL ANTI-INFLAMMATORY DRUGS (NSAID'S)  Using products such as Advil (ibuprofen), Aleve (naproxen), Motrin (ibuprofen) for additional pain control during fracture healing can delay and/or prevent the healing response.  If you would like to take over the counter (OTC) medication, Tylenol (acetaminophen) is ok.  However, some narcotic medications that are given for pain control contain acetaminophen as well. Therefore, you should not exceed more than 4000 mg of tylenol in a day if you do not have liver disease.  Also note that there are may OTC medicines, such as cold medicines and allergy medicines that my contain tylenol as well.  If you have any questions about medications and/or interactions please ask your doctor/PA or your pharmacist.   PAIN MEDICATION USE AND EXPECTATIONS  You have likely been given narcotic medications to help control your pain.  After a traumatic event that results in an fracture (broken bone) with or without surgery, it is ok to use narcotic pain  medications to help control one's pain.  We understand that everyone responds to pain differently and each individual patient will be evaluated on a regular basis for the continued need for narcotic medications. Ideally, narcotic medication use should last no more than 6-8 weeks (coinciding with fracture healing).   As a patient it is your responsibility as well to monitor narcotic medication use and report the amount and frequency you use these medications when you come to your office visit.   We would also advise that if you are using narcotic medications, you should take a dose prior to therapy to maximize you participation.  IF YOU ARE ON NARCOTIC MEDICATIONS IT IS NOT PERMISSIBLE TO OPERATE A MOTOR VEHICLE (MOTORCYCLE/CAR/TRUCK/MOPED) OR HEAVY MACHINERY  DO NOT MIX NARCOTICS WITH OTHER CNS (CENTRAL NERVOUS SYSTEM) DEPRESSANTS SUCH AS ALCOHOL       ICE AND ELEVATE INJURED/OPERATIVE EXTREMITY  Using ice and elevating the injured extremity above your heart can help with swelling and pain control.  Icing in a pulsatile fashion, such as 20 minutes on and 20 minutes off, can be followed.    Do not place ice directly on skin. Make sure there is a barrier between to skin and the ice pack.    Using frozen items such as frozen peas works well as the conform nicely to the are that needs to be iced.  USE AN ACE WRAP OR TED HOSE FOR SWELLING CONTROL  In addition to icing and elevation, Ace wraps or TED hose are used to help limit and resolve swelling.  It is recommended to use Ace wraps or TED hose until you are informed to stop.    When using Ace Wraps start the wrapping distally (farthest away from the body) and wrap proximally (closer to the body)   Example: If you had surgery on your leg or thing and you do not have a splint on, start the ace wrap at the toes and work your way up to the thigh        If you had surgery on your upper extremity and do not have a splint on, start the ace wrap at your  fingers and work your way up to the upper arm  IF YOU ARE IN A SPLINT OR CAST DO NOT [place]   If your splint gets wet for any reason please contact the office immediately. You may shower in your splint or cast as long as you keep it dry.  This can be done by wrapping in a cast cover or garbage back (or similar)  Do Not stick any thing down your splint or cast such as pencils, money, or hangers to try and scratch yourself with.  If you feel itchy take benadryl as prescribed on the bottle for itching  IF YOU ARE IN A CAM BOOT (BLACK BOOT)  You may remove boot periodically. Perform daily dressing changes as noted below.  Wash the liner of the boot regularly and wear a sock when wearing the boot. It is recommended that you sleep in the boot until told otherwise  CALL THE OFFICE WITH ANY QUESTIONS OR CONCERTS: [phone]     Discharge Pin Site Instructions  Dress pins daily with Kerlix roll starting on POD 2. Wrap the Kerlix so that it tamps the skin down around the pin-skin interface to prevent/limit motion of the skin relative to the pin.  (Pin-skin motion is the primary cause of pain and infection related to external fixator pin sites).  Remove any crust or coagulum that may obstruct drainage with a saline moistened gauze or soap and water.  After POD 3, if there is no discernable drainage on the pin site dressing, the interval for change can by increased to every other day.  You may shower with the fixator, cleaning all pin sites gently with soap and water.  If you have a surgical wound this needs to be completely dry and without drainage before showering.  The extremity can be lifted by the fixator to facilitate wound care and transfers.  Notify the office/Doctor if you experience increasing drainage, redness, or pain from a pin site, or if you notice purulent (thick, snot-like) drainage.  Discharge Wound Care Instructions  Do NOT apply  any ointments, solutions or  lotions to pin sites or surgical wounds.  These prevent needed drainage and even though solutions like hydrogen peroxide kill bacteria, they also damage cells lining the pin sites that help fight infection.  Applying lotions or ointments can keep the wounds moist and can cause them to breakdown and open up as well. This can increase the risk for infection. When in doubt call the office.  Surgical incisions should be dressed daily.  If any drainage is noted, use one layer of adaptic, then gauze, Kerlix, and an ace wrap.  Once the incision is completely dry and without drainage, it may be left open to air out.  Showering may begin 36-48 hours later.  Cleaning gently with soap and water.  Traumatic wounds should be dressed daily as well.    One layer of adaptic, gauze, Kerlix, then ace wrap.  The adaptic can be discontinued once the draining has ceased    If you have a wet to dry dressing: wet the gauze with saline the squeeze as much saline out so the gauze is moist (not soaking wet), place moistened gauze over wound, then place a dry gauze over the moist one, followed by Kerlix wrap, then ace wrap.     Driving restrictions    Complete by:  As directed   No driving     Increase activity slowly as tolerated    Complete by:  As directed      Posterior total hip precautions    Complete by:  As directed   R hip     Touch down weight bearing    Complete by:  As directed   Laterality:  right  Extremity:  Lower     Touch down weight bearing    Complete by:  As directed   Laterality:  right  Extremity:  Lower     Wound care    Complete by:  As directed   Daily dry dressing changes to R hip Ok to shower Can leave wound open to air once wound completely dry            Medication List         DSS 100 MG Caps  Take 100 mg by mouth 2 (two) times daily.     feeding supplement (ENSURE COMPLETE) Liqd  Take 237 mLs by mouth 2 (two) times daily between meals.     methocarbamol 500 MG  tablet  Commonly known as:  ROBAXIN  Take 1-2 tablets (500-1,000 mg total) by mouth every 6 (six) hours as needed for muscle spasms.     multivitamin with minerals Tabs tablet  Take 1 tablet by mouth daily.     oxyCODONE 5 MG immediate release tablet  Commonly known as:  Oxy IR/ROXICODONE  Take 1-2 tablets (5-10 mg total) by mouth every 6 (six) hours as needed for breakthrough pain (take between percocet for breakthrough pain).     oxyCODONE-acetaminophen 5-325 MG per tablet  Commonly known as:  PERCOCET/ROXICET  Take 1-2 tablets by mouth every 6 (six) hours as needed for moderate pain or severe pain.     pantoprazole 40 MG tablet  Commonly known as:  PROTONIX  Take 1 tablet (40 mg total) by mouth daily.     warfarin 5 MG tablet  Commonly known as:  COUMADIN  Take 1 tablet (5 mg total) by mouth daily.       Follow-up Information   Follow up with Rozanna Box, MD. Schedule an appointment  as soon as possible for a visit in 14 days. (For wound re-check, For suture removal)    Specialty:  Orthopedic Surgery   Contact information:   [address] [place] [address] [phone]       Discharge Instructions and Plan:  See full dictation  Signed:  Jari Pigg, PA-C [facility] [phone] (P) 01/18/2014, 9:10 AM  **Disclaimer: This note may have been dictated with voice recognition software. Similar sounding words can inadvertently be transcribed and this note may contain transcription errors which may not have been corrected upon publication of note.**

## 2014-01-18 NOTE — Care Management Note (Signed)
CARE MANAGEMENT NOTE 01/18/2014  Patient:  HERLEY, BERNARDINI   Account Number:  [id number]  Date Initiated:  01/16/2014  Documentation initiated by:  Ricki Miller  Subjective/Objective Assessment:   63 yr old male admitted with right acetabular fracture, s/p ORIF of right T type acetabulum fracture.     Action/Plan:   Case manager spoke with patient concerning needs Secor RN for coumadin.Patient  has a rolling walker. Referral called to Pearletha [place], [place] liaison. Patient will be going to [place], [place]   Anticipated DC Date:  01/18/2014   Anticipated DC Plan:  [place]  CM consult      [facility] Choice  HOME HEALTH   Choice offered to / List presented to:  C-1 Patient   DME arranged  NA        [place] arranged  HH-1 RN  [place]      [place].   Status of service:  Completed, signed off Medicare Important Message given?   (If response is "NO", the following Medicare IM given date fields will be blank) Date Medicare IM given:   Medicare IM given by:   Date Additional Medicare IM given:   Additional Medicare IM given by:    Discharge Disposition:  [place]  Per UR Regulation:  Reviewed for med. necessity/level of care/duration of stay

## 2014-01-18 NOTE — Discharge Summary (Signed)
NAMEDREW, LIPS NO.:  [id number]  MEDICAL RECORD NO.:  [id number]  LOCATION:  5N07C                        FACILITY:  [facility]  PHYSICIAN:  Astrid Divine. Marcelino Scot, M.D. DATE OF BIRTH:  01/01/1951  DATE OF ADMISSION:  01/14/2014 DATE OF DISCHARGE:  01/18/2014                              DISCHARGE SUMMARY   ADMISSION DIAGNOSES: 1. Fall. 2. Right T-type acetabulum fracture. 3. Nicotine dependence. 4. Alcohol abuse.  DISCHARGE DIAGNOSES/PRINCIPAL PROBLEM: 1. Closed right T-type acetabulum fracture. 2. Nicotine dependence. 3. Alcohol abuse. 4. Fall.  PROCEDURES PERFORMED:  On January 15, 2014, ORIF right T-type acetabulum fracture involving both columns through  anterior Stoppa approach.  DISCHARGE CONDITION:  Good.  HOSPITAL COURSE:  Michael [name] is a 63 year old white male, who was admitted on January 14, 2014, to the service of Dr. Ninfa Linden, Orthopedics.  Given the injury to his right acetabulum, the Orthopedic Trauma Service was consulted for definitive management.  The patient was walking on an embankment on January 14, 2014, when he slipped and fell down the embankment landing on his right hip while collecting cans.  The patient was brought to [facility] Emergency Department where he was found to have a complex comminuted right acetabulum fracture.  There was significant intrusion of the quadrilateral plate.  Orthopedic Trauma Service was consulted as this was a very complex injury and required the expertise of an orthopedic traumatologist.  The patient was seen and evaluated by the Orthopedic Trauma Service on January 15, 2014, and was taken to surgery later that day.  On admission, the patient was started on CIWA protocol for his chronic alcohol use.  The patient tolerated surgery well.  After surgery, he was brought to the PACU for recovery from anesthesia and then was brought up to the orthopedic floor for continued observation and pain control.  Immediately,  postoperatively, the patient was started on a clear liquid diet for unknown reasons overnight on postop 0 to postop day 1.  My order was changed from clear liquid diet to a regular diet.  The patient did have a full breakfast. Once this was seen, I changed the order back to a full liquid diet.  The patient did have some mild distention of his belly on postoperative day #1 but did have some active bowel sounds.  He participated with physical therapy on postoperative day #1, he was unable to pass any gas and no bowel movement on postop day 1 as well.  Foley was in place as well on postoperative day #1 to assist with voiding as well as to monitor his I and Os.  He did receive 2 units of packed red blood cells in the OR as well.  On postoperative day #1, I did start him on scheduled Reglan to help minimize the chances of an ileus.  Overnight into postoperative day #2, the patient was having some abdominal pain and discomfort as well as some appreciable distention and nausea.  His Reglan was increased to 10 mg IV q.6 hours and a suppository was given with effect and relief of his belly pain.  On postoperative day #2, the patient continued to do well.  He worked a little bit  better with Physical Therapy and his Foley was removed.  We did discuss discharge plans in which the patient wished to discharge home with his daughter with home health.  The patient was started on Lovenox on postoperative day #1 as well as a bridge to Coumadin as he will require anticoagulation for 8 weeks given his acetabulum fracture.  On postoperative day #3, the patient was doing much better.  He was tolerating full liquid diet and wanted to try regular foods.  He was able to eat a regular lunch and was deemed to be stable for discharge to home.  As he is able to mobilize well with his walker he had several bowel movements and able to void on his own without a Foley.  Therefore, on postoperative day #3, January 18, 2014, the patient was discharged to home in stable condition.  CONSULTS:  None.  SIGNIFICANT DIAGNOSTIC STUDIES:  Labs for January 18, 2014, sodium 139, potassium 4.3, chloride 104, bicarb 24, BUN 9, creatinine 0.81, calcium 8.7, glucose 106, anion gap 11.  CBC:  White blood cells 6.0, hemoglobin 9.9, hematocrit 28.5, platelets 133, INR was 2.0.  TREATMENTS RENDERED:  IV hydration.  Antibiotics including Ancef. Analgesia includes Dilaudid, Percocet, and OxyIR.  Anticoagulation with low-molecular weight heparin and Coumadin.  Therapies included PT, OT, and RN.  PROCEDURES:  Blood transfusion 2 units packed red cells in the OR.  DISCHARGE EXAM:  January 18, 2014, subjective, the patient doing great and ready to go home.  Positive bowel movement.  Positive flatus, voiding well without Foley.  Right hip feels great.  Pain is well controlled.  REVIEW OF SYSTEMS:  Unremarkable other than some mild right hip soreness.  OBJECTIVE:  VITAL SIGNS:  BP 117/62, pulse 69, respirations 16, at 96% on room air, temperature 99 degrees. GENERAL:  The patient sitting in bedside chair.  Awake, alert, in no acute distress.  LUNGS:  Clear to auscultation bilaterally. CARDIAC:  Regular rate and rhythm, S1 and S2. ABDOMEN:  Soft, nontender.  Positive bowel sounds.  Pelvic incision looks great.  EXTREMITY:  Right lower extremity TED hose in place. Swelling is well-controlled.  Extremities warm and palpable dorsalis pedis pulse.  No deep calf tenderness noted.  Compartments are soft and nontender.  No pain with passive stretch.  Deep peroneal nerve, superficial peroneal nerve, and tibial nerve sensory functions are intact.  EHL, FHL, anterior tibialis, posterior tibialis, peroneal, gastroc-soleus complex, motor function are intact.  ASSESSMENT AND PLAN:  Postoperative day #57, a 63 year old male status post fall down embankment with complex right acetabulum fracture. 1. Fall. 2. Right T-type acetabulum  fracture with protrusion of the     quadrilateral surface status post open reduction and internal     fixation.       Touchdown weightbearing x8 weeks.      Posterior hip precautions x12 weeks.      PT and OT daily.      Ice as needed.       TED hose.      Change dressing p.r.n.       Okay to shower with assist. 3. EtOH dependence.  The patient has been on fever protocol and has     been very stable.      No signs of withdrawal. 4 Nicotine dependence.  No nicotine products as he has delayed bone     and wound healing. 5. Pain management Percocet 5/325, 1-2 p.o. q.6 hours as needed.  OxyIR 5 mg 1-2 q.3 hours as needed for breakthrough pain take     between Percocet.       Robaxin 1000 mg q.6 hours. 6. Acute blood loss anemia/hemodynamics stable.       The patient has received a total of 2 units of packed red blood     cells during his hospitalization. 7. Deep vein thrombosis and pulmonary embolus prophylaxis.  Lovenox     bridged to Coumadin, INR is trending up, we will discharge home and     adjust the Coumadin at this time. 8. ID.  The patient received perioperative Ancef. 9. Activity       touchdown weightbearing right leg.      Home health, PT.      Posterior hip precautions right hip. 10.Fluids, electrolytes, nutrition/Foley/lines.      Ileus, improved.  Advance to regular diet for lunch.      Discontinue IV and IV fluids. 11.Impediments in fracture healing nicotine dependence and alcohol     abuse. 12.Disposition:  Discharge home today after lunch.  Follow up with     Orthopedics in 10-14 days.   DISCHARGE MEDICATIONS: 1. Colace 100 mg p.o. b.i.d. 2. Ensure supplement b.i.d. 3. Robaxin 500 mg 1-2 p.o. q.6 hours as needed for spasms. 4. Multivitamin 1 p.o. daily. 5. OxyIR 5 mg 1-2 p.o. q.6 hours as needed for breakthrough pain take     between Percocet. 6. Percocet 5/325, 1-2 p.o. q.6 hours as needed for pain. 7. Protonix 40 mg 1 p.o. daily. 8. Coumadin 5 mg  take as directed per pharmacy.  FOLLOWUP INFORMATION:  The patient will follow up with Dr. Marcelino Scot, orthopedic trauma specialist in 10-14 days.  The phone [phone].  DISCHARGE INSTRUCTIONS AND PLAN:  The patient has sustained a severe injury to his right hip and has had increased risk for the development of post-traumatic arthritis.  We were able to achieve excellent fixation and are hopeful that he will go on and unite uneventfully without any complications.  The patient will be touchdown weightbearing for the next 8 weeks with graduated weightbearing thereafter.  He will have posterior hip precautions in place for the next 12 weeks as he did have a small incomplete fracture through his posterior wall.  The patient will work with Physical Therapy on a regular basis at home.  He will be on Coumadin for the next 8 weeks as well.  He will try and optimize his nutrition as best as possible to optimize his healing potential.  I am concerned given his nicotine dependence and alcohol abuse that he may be noncompliant and may have failure of his fixation.  Again, we will check the patient back in 2 weeks for re- evaluation, followup x-rays. And removal of his sutures at that time.     Michael Pigg, PA-C   ______________________________ Astrid Divine. Marcelino Scot, M.D.    KWP/MEDQ  D:  01/18/2014  T:  01/18/2014  Job:  [id number]

## 2014-01-18 NOTE — Progress Notes (Signed)
D/C instructions and scripts given. Pt verbalized understanding of home care and felt comfortable with his discharge to home. Family a the bedside to transport Pt home.

## 2014-01-21 NOTE — Progress Notes (Signed)
Agree with PTA.    Sha Amer, PT [phone]  

## 2014-04-10 DIAGNOSIS — S32491D Other specified fracture of right acetabulum, subsequent encounter for fracture with routine healing: Secondary | ICD-10-CM | POA: Diagnosis not present

## 2014-05-14 ENCOUNTER — Encounter: Payer: Self-pay | Admitting: Internal Medicine

## 2014-05-14 ENCOUNTER — Ambulatory Visit (INDEPENDENT_AMBULATORY_CARE_PROVIDER_SITE_OTHER): Payer: Medicare Other | Admitting: Internal Medicine

## 2014-05-14 VITALS — BP 170/90 | HR 76 | Temp 98.1°F | Resp 16 | Ht 70.25 in | Wt 178.0 lb

## 2014-05-14 DIAGNOSIS — Z79899 Other long term (current) drug therapy: Secondary | ICD-10-CM | POA: Diagnosis not present

## 2014-05-14 DIAGNOSIS — Z125 Encounter for screening for malignant neoplasm of prostate: Secondary | ICD-10-CM

## 2014-05-14 DIAGNOSIS — E782 Mixed hyperlipidemia: Secondary | ICD-10-CM | POA: Diagnosis not present

## 2014-05-14 DIAGNOSIS — I10 Essential (primary) hypertension: Secondary | ICD-10-CM

## 2014-05-14 DIAGNOSIS — R7303 Prediabetes: Secondary | ICD-10-CM

## 2014-05-14 DIAGNOSIS — E559 Vitamin D deficiency, unspecified: Secondary | ICD-10-CM

## 2014-05-14 DIAGNOSIS — R7309 Other abnormal glucose: Secondary | ICD-10-CM | POA: Diagnosis not present
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
## 2014-05-15 LAB — PSA: PSA: 2.07 ng/mL (ref <=4.00)

## 2014-05-15 LAB — BASIC METABOLIC PANEL WITH GFR
BUN: 10 mg/dL (ref 6–23)
CHLORIDE: 104 meq/L (ref 96–112)
CO2: 29 meq/L (ref 19–32)
Calcium: 10 mg/dL (ref 8.4–10.5)
Creat: 0.81 mg/dL (ref 0.50–1.35)
GFR, Est African American: >89 mL/min
GFR, Est Non African American: >89 mL/min
GLUCOSE: 97 mg/dL (ref 70–99)
POTASSIUM: 5.2 meq/L (ref 3.5–5.3)
Sodium: 139 mEq/L (ref 135–145)

## 2014-05-15 LAB — CBC WITH DIFFERENTIAL/PLATELET
BASOS PCT: 0 % (ref 0–1)
Basophils Absolute: 0 10*3/uL (ref 0.0–0.1)
Eosinophils Absolute: 0.1 10*3/uL (ref 0.0–0.7)
Eosinophils Relative: 1 % (ref 0–5)
HEMATOCRIT: 42.2 % (ref 39.0–52.0)
HEMOGLOBIN: 14.7 g/dL (ref 13.0–17.0)
LYMPHS ABS: 1.6 10*3/uL (ref 0.7–4.0)
LYMPHS PCT: 25 % (ref 12–46)
MCH: 29.9 pg (ref 26.0–34.0)
MCHC: 34.8 g/dL (ref 30.0–36.0)
MCV: 85.8 fL (ref 78.0–100.0)
MONO ABS: 0.6 10*3/uL (ref 0.1–1.0)
MONOS PCT: 9 % (ref 3–12)
MPV: 10.6 fL (ref 9.4–12.4)
NEUTROS ABS: 4.1 10*3/uL (ref 1.7–7.7)
Neutrophils Relative %: 65 % (ref 43–77)
Platelets: 203 10*3/uL (ref 150–400)
RBC: 4.92 MIL/uL (ref 4.22–5.81)
RDW: 15 % (ref 11.5–15.5)
WBC: 6.3 10*3/uL (ref 4.0–10.5)

## 2014-05-15 LAB — HEPATIC FUNCTION PANEL
ALK PHOS: 73 U/L (ref 39–117)
ALT: 9 U/L (ref 0–53)
AST: 16 U/L (ref 0–37)
Albumin: 4.5 g/dL (ref 3.5–5.2)
BILIRUBIN DIRECT: 0.1 mg/dL (ref 0.0–0.3)
BILIRUBIN INDIRECT: 0.3 mg/dL (ref 0.2–1.2)
TOTAL PROTEIN: 6.7 g/dL (ref 6.0–8.3)
Total Bilirubin: 0.4 mg/dL (ref 0.2–1.2)

## 2014-05-15 LAB — LIPID PANEL
CHOL/HDL RATIO: 3.1 ratio
Cholesterol: 172 mg/dL (ref 0–200)
HDL: 55 mg/dL (ref >39)
LDL CALC: 107 mg/dL — AB (ref 0–99)
Triglycerides: 50 mg/dL (ref <150)
VLDL: 10 mg/dL (ref 0–40)

## 2014-05-15 LAB — MAGNESIUM: Magnesium: 2.1 mg/dL (ref 1.5–2.5)

## 2014-05-15 LAB — TSH: TSH: 2.348 u[IU]/mL (ref 0.350–4.500)

## 2014-05-15 LAB — VITAMIN D 25 HYDROXY (VIT D DEFICIENCY, FRACTURES): Vit D, 25-Hydroxy: 34 ng/mL (ref 30–100)

## 2014-05-15 LAB — INSULIN, FASTING: Insulin fasting, serum: 4.1 u[IU]/mL (ref 2.0–19.6)

## 2014-05-15 LAB — HEMOGLOBIN A1C
HEMOGLOBIN A1C: 5.7 % — AB (ref <5.7)
MEAN PLASMA GLUCOSE: 117 mg/dL — AB (ref <117)

## 2014-05-29 DIAGNOSIS — S32491D Other specified fracture of right acetabulum, subsequent encounter for fracture with routine healing: Secondary | ICD-10-CM | POA: Diagnosis not present

## 2014-06-24 ENCOUNTER — Ambulatory Visit (INDEPENDENT_AMBULATORY_CARE_PROVIDER_SITE_OTHER): Payer: Medicare Other | Admitting: Internal Medicine

## 2014-06-24 ENCOUNTER — Encounter: Payer: Self-pay | Admitting: Internal Medicine

## 2014-06-24 VITALS — BP 152/78 | HR 68 | Temp 98.1°F | Resp 16 | Ht 70.25 in | Wt 180.4 lb

## 2014-06-24 DIAGNOSIS — R7309 Other abnormal glucose: Secondary | ICD-10-CM

## 2014-06-24 DIAGNOSIS — E782 Mixed hyperlipidemia: Secondary | ICD-10-CM | POA: Insufficient documentation

## 2014-06-24 DIAGNOSIS — I10 Essential (primary) hypertension: Secondary | ICD-10-CM

## 2014-06-24 DIAGNOSIS — E559 Vitamin D deficiency, unspecified: Secondary | ICD-10-CM

## 2014-06-24 DIAGNOSIS — Z9181 History of falling: Secondary | ICD-10-CM

## 2014-06-24 DIAGNOSIS — R7303 Prediabetes: Secondary | ICD-10-CM

## 2014-06-24 DIAGNOSIS — F1010 Alcohol abuse, uncomplicated: Secondary | ICD-10-CM

## 2014-06-24 DIAGNOSIS — Z1331 Encounter for screening for depression: Secondary | ICD-10-CM

## 2014-06-24 DIAGNOSIS — F17210 Nicotine dependence, cigarettes, uncomplicated: Secondary | ICD-10-CM

## 2014-06-24 NOTE — Progress Notes (Signed)
Patient ID: Michael Fields, male   DOB: November 26, 1950, 64 y.o.   MRN: [id number]    MEDICARE ANNUAL WELLNESS VISIT AND f/u OV  Assessment:   1. Essential hypertension  -Rx gZiac 5/6.25 x 6 mo  2. Mixed hyperlipidemia  - recc stricter diet  3. Prediabetes   4. Vitamin D deficiency  - advised start Vit D 10,000 u/da  5. Alcohol abuse  - advised abstinence   6. Cigarette nicotine dependence   - advised tobacco cessation  7. At low risk for fall   8. Depression screen  -  Negative screen   Plan:   During the course of the visit the patient was educated and counseled about appropriate screening and preventive services including:    Pneumococcal vaccine   Influenza vaccine  Td vaccine  Screening electrocardiogram  Bone densitometry screening  Colorectal cancer screening  Diabetes screening  Glaucoma screening  Nutrition counseling   Advanced directives: requested  Screening recommendations, referrals: Vaccinations: Tdap vaccine 01/14/2014 Influenza vaccine declined Pneumococcal vaccine declined Prevnar vaccine declined Shingles vaccine declined Hep B vaccine not indicated  Nutrition assessed and recommended  Colonoscopy declined Recommended yearly ophthalmology/optometry visit for glaucoma screening and checkup Recommended yearly dental visit for hygiene and checkup Advanced directives - no  Conditions/risks identified: BMI: Discussed weight loss, diet, and increase physical activity.  Increase physical activity: AHA recommends 150 minutes of physical activity a week.  Medications reviewed PreDiabetes is not at goal, ACE/ARB therapy: Not Indicated Urinary Incontinence is not an issue: discussed non pharmacology and pharmacology options.  Fall risk: low- discussed PT, home fall assessment, medications.    Subjective:  Michael Fields  presents for [employer] Visit and f/u OV to monitor BPl.    This very nice 64 y.o. [name]  presents for  follow up of elevated BP w/ hx/o Hypertension, Hyperlipidemia, Pre-Diabetes and Vitamin D Deficiency.    Patient is monitored for HTN with BP 170/90 about 1 mo ago at his initial OV. Today's BP is still elevated at 152/78. Patient has had no complaints of any cardiac type chest pain, palpitations, dyspnea/orthopnea/PND, dizziness, claudication, or dependent edema.   Hyperlipidemia is not controlled with diet. Patient denies myalgias or other med SE's. Last Lipids werenot at goal with an elevated LDL Cholesterol - Total Chol 172; HDL 55; LDL 107*; Trig 50 on 05/14/2014.   Also, the patient has history of PreDiabetes and has had no symptoms of reactive hypoglycemia, diabetic polys, paresthesias or visual blurring.  Last A1c was  5.7% on 05/14/2014.   Further, the patient also has history of Vitamin D Deficiency (D = 34 in Dec 2015) and he's not started supplementing Vit D due to hisfinances. D without any suspected side-effects. Last vitamin D was 34 on  05/14/2014.  Names of Other Physician/Practitioners you currently use: 1. [facility] here for primary care 2. None, eye doctor, last visit >5 yrs 3. None, dentist, last visit 10 +/- yrs ago  Patient Care Team: Michael Pinto, MD as PCP - General (Internal Medicine) Michael Crumb, MD as Consulting Physician (Orthopedic Surgery) Michael Box, MD as Consulting Physician (Orthopedic Surgery)  Medication Review: Medication Sig  .  PERCOCET 5-325  Take 1-2 tablets by mouth every 6 (six) hours as needed for moderate pain or severe pain.   Current Problems (verified) Patient Active Problem List   Diagnosis Date Noted  . HTN 05/14/2014  . Prediabetes 05/14/2014  . Vitamin D deficiency 05/14/2014  .  Nicotine dependence 01/17/2014  . Alcohol abuse 01/17/2014  . Right T-type acetabulum fracture, closed 01/14/2014  . Acetabular fracture 01/14/2014   Screening Tests Health Maintenance   Topic Date Due  . COLONOSCOPY  02/08/1999  . ZOSTAVAX  02/07/2009  . INFLUENZA VACCINE  12/22/2013  . PNEUMOCOCCAL POLYSACCHARIDE VACCINE AGE 38 AND OVER  02/07/2014  . TETANUS/TDAP  01/15/2024    Immunization History  Administered Date(s) Administered  . Tdap 01/14/2014   Preventative care: Last colonoscopy: never - declines due to finances  Prior vaccinations:  Tdap: 01/14/2014  Influenza: declines  Pneumococcal: no Shingles/Zostavax: no  History reviewed: allergies, current medications, past family history, past medical history, past social history, past surgical history and problem list  Risk Factors: Tobacco History  Substance Use Topics  . Smoking status: Current Every Day Smoker -- 1.00 packs/day    Types: Cigarettes  . Smokeless tobacco: Not on file  . Alcohol Use: 0.0 oz/week    0 Not specified per week     Comment: 8-12 beers a day-   He does smoke.  Are there smokers in your home (other than you)?  No  Alcohol Current alcohol use: 1 case beer/week  Caffeine Current caffeine use: coffee 1-2 cups /day, tea 1-2 /day and caffeinated soft drinks 1-2 /day  Exercise Current exercise: walking and physical therapy  Nutrition/Diet Current diet: walking and p[hysical therapy  Cardiac risk factors: dyslipidemia, family history of premature cardiovascular disease, hypertension, male gender and smoking/ tobacco exposure.  Depression Screen (Note: if answer to either of the following is "Yes", a more complete depression screening is indicated)   Q1: Over the past two weeks, have you felt down, depressed or hopeless? No  Q2: Over the past two weeks, have you felt little interest or pleasure in doing things? No  Have you lost interest or pleasure in daily life? No  Do you often feel hopeless? No  Do you cry easily over simple problems? No  Activities of Daily Living In your present state of health, do you have any difficulty performing the following activities?:   Driving? No Managing money?  No Feeding yourself? No Getting from bed to chair? No Climbing a flight of stairs? No Preparing food and eating?: No Bathing or showering? No Getting dressed: No Getting to the toilet? No Using the toilet:No Moving around from place to place: No In the past year have you fallen or had a near fall?:Yes   Are you sexually active?  Yes  Do you have more than one partner?  No  Vision Difficulties: No  Hearing Difficulties: No Do you often ask people to speak up or repeat themselves? No Do you experience ringing or noises in your ears? No Do you have difficulty understanding soft or whispered voices? No  Cognition  Do you feel that you have a problem with memory?No  Do you often misplace items? No  Do you feel safe at home?  Yes  Advanced directives Does patient have a [place]? No - offered forms Does patient have a Living Will? No- offered forms  ROS: In addition to the HPI above,  No Fever-chills,  No Headache, No changes with Vision or hearing,  No problems swallowing food or Liquids,  No Chest pain or productive Cough or Shortness of Breath,  No Abdominal pain, No Nausea or Vomitting, Bowel movements are regular,  No Blood in stool or Urine,  No dysuria,  No new skin rashes or bruises,  No  new joints pains-aches,  No new weakness, tingling, numbness in any extremity,  No recent weight loss,  No polyuria, polydypsia or polyphagia,  No significant Mental Stressors.  A full 10 point Review of Systems was done, except as stated above, all other Review of Systems were negative   Objective:     BP 152/78   Pulse 68  Temp 98.1 F   Resp 16  Ht 5' 10.25"   Wt 180 lb 6.4 oz    BMI 25.71   General appearance: alert, no distress, WD/WN, male   Cognitive Testing  Alert? Yes  Normal Appearance? Yes  Oriented to person? Yes  Place? Yes   Time? Yes  Recall of three objects?  Yes  Can perform simple calculations?  Yes  Displays appropriate judgment? Yes  Can read the correct time from a watch/clock? Yes  HEENT: normocephalic, sclerae anicteric, TMs pearly, nares patent, no discharge or erythema, pharynx normal Oral cavity: MMM, no lesions Neck: supple, no lymphadenopathy, no thyromegaly, no masses Heart: RRR, normal S1, S2, no murmurs Lungs: CTA bilaterally, no wheezes, rhonchi, or rales Abdomen: +bs, soft, non tender, non distended, no masses, no hepatomegaly, no splenomegaly Musculoskeletal: nontender, no swelling, no obvious deformity Extremities: no edema, no cyanosis, no clubbing Pulses: 2+ symmetric, upper and lower extremities, normal cap refill Neurological: alert, oriented x 3, CN2-12 intact, strength normal upper extremities and lower extremities, sensation normal throughout, DTRs 2+ throughout, no cerebellar signs, gait normal Psychiatric: normal affect, behavior normal, pleasant   Medicare Attestation I have personally reviewed: The patient's medical and social history Their use of alcohol, tobacco or illicit drugs Their current medications and supplements The patient's functional ability including ADLs,fall risks, home safety risks, cognitive, and hearing and visual impairment Diet and physical activities Evidence for depression or mood disorders  The patient's weight, height, BMI, and visual acuity have been recorded in the chart.  I have made referrals, counseling, and provided education to the patient based on review of the above and I have provided the patient with a written personalized care plan for preventive services.    Jullisa Grigoryan DAVID, MD   06/24/2014

## 2014-06-27 ENCOUNTER — Telehealth: Payer: Self-pay | Admitting: *Deleted

## 2014-06-27 NOTE — Telephone Encounter (Signed)
Left message to inform patient to start Vitamin D 10000 units a day and we have the forms to do a living will ,if he is interested.

## 2014-07-25 ENCOUNTER — Ambulatory Visit (INDEPENDENT_AMBULATORY_CARE_PROVIDER_SITE_OTHER): Payer: Medicare Other | Admitting: Internal Medicine

## 2014-07-25 ENCOUNTER — Encounter: Payer: Self-pay | Admitting: Internal Medicine

## 2014-07-25 VITALS — BP 114/66 | HR 60 | Temp 97.5°F | Resp 16 | Ht 70.25 in | Wt 173.2 lb

## 2014-07-25 DIAGNOSIS — I10 Essential (primary) hypertension: Secondary | ICD-10-CM | POA: Diagnosis not present

## 2014-07-25 DIAGNOSIS — E782 Mixed hyperlipidemia: Secondary | ICD-10-CM

## 2014-07-25 DIAGNOSIS — R7309 Other abnormal glucose: Secondary | ICD-10-CM

## 2014-07-25 DIAGNOSIS — R7303 Prediabetes: Secondary | ICD-10-CM

## 2014-07-25 DIAGNOSIS — E559 Vitamin D deficiency, unspecified: Secondary | ICD-10-CM | POA: Diagnosis not present
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
## 2014-09-05 DIAGNOSIS — S32491D Other specified fracture of right acetabulum, subsequent encounter for fracture with routine healing: Secondary | ICD-10-CM | POA: Diagnosis not present

## 2014-10-30 ENCOUNTER — Encounter: Payer: Self-pay | Admitting: Internal Medicine

## 2014-10-30 ENCOUNTER — Ambulatory Visit (INDEPENDENT_AMBULATORY_CARE_PROVIDER_SITE_OTHER): Payer: Medicare Other | Admitting: Internal Medicine

## 2014-10-30 VITALS — BP 138/76 | HR 80 | Temp 98.2°F | Resp 16 | Ht 70.25 in | Wt 176.0 lb

## 2014-10-30 DIAGNOSIS — E559 Vitamin D deficiency, unspecified: Secondary | ICD-10-CM

## 2014-10-30 DIAGNOSIS — R7309 Other abnormal glucose: Secondary | ICD-10-CM | POA: Diagnosis not present

## 2014-10-30 DIAGNOSIS — I10 Essential (primary) hypertension: Secondary | ICD-10-CM

## 2014-10-30 DIAGNOSIS — Z79899 Other long term (current) drug therapy: Secondary | ICD-10-CM | POA: Diagnosis not present

## 2014-10-30 DIAGNOSIS — F17210 Nicotine dependence, cigarettes, uncomplicated: Secondary | ICD-10-CM

## 2014-10-30 DIAGNOSIS — E782 Mixed hyperlipidemia: Secondary | ICD-10-CM

## 2014-10-30 DIAGNOSIS — R7303 Prediabetes: Secondary | ICD-10-CM

## 2014-10-30 LAB — HEPATIC FUNCTION PANEL
ALT: 12 U/L (ref 0–53)
AST: 21 U/L (ref 0–37)
Albumin: 4.5 g/dL (ref 3.5–5.2)
Alkaline Phosphatase: 57 U/L (ref 39–117)
Bilirubin, Direct: 0.2 mg/dL (ref 0.0–0.3)
Indirect Bilirubin: 0.8 mg/dL (ref 0.2–1.2)
Total Bilirubin: 1 mg/dL (ref 0.2–1.2)
Total Protein: 6.5 g/dL (ref 6.0–8.3)

## 2014-10-30 LAB — CBC WITH DIFFERENTIAL/PLATELET
BASOS PCT: 1 % (ref 0–1)
Basophils Absolute: 0.1 10*3/uL (ref 0.0–0.1)
EOS PCT: 1 % (ref 0–5)
Eosinophils Absolute: 0.1 10*3/uL (ref 0.0–0.7)
HCT: 44 % (ref 39.0–52.0)
HEMOGLOBIN: 15.1 g/dL (ref 13.0–17.0)
LYMPHS PCT: 28 % (ref 12–46)
Lymphs Abs: 1.8 10*3/uL (ref 0.7–4.0)
MCH: 31.7 pg (ref 26.0–34.0)
MCHC: 34.3 g/dL (ref 30.0–36.0)
MCV: 92.2 fL (ref 78.0–100.0)
MONO ABS: 0.6 10*3/uL (ref 0.1–1.0)
MPV: 11.4 fL (ref 8.6–12.4)
Monocytes Relative: 9 % (ref 3–12)
Neutro Abs: 3.8 10*3/uL (ref 1.7–7.7)
Neutrophils Relative %: 61 % (ref 43–77)
Platelets: 175 10*3/uL (ref 150–400)
RBC: 4.77 MIL/uL (ref 4.22–5.81)
RDW: 13.8 % (ref 11.5–15.5)
WBC: 6.3 10*3/uL (ref 4.0–10.5)

## 2014-10-30 LAB — BASIC METABOLIC PANEL WITH GFR
BUN: 12 mg/dL (ref 6–23)
CO2: 28 mEq/L (ref 19–32)
CREATININE: 0.86 mg/dL (ref 0.50–1.35)
Calcium: 9.5 mg/dL (ref 8.4–10.5)
Chloride: 102 mEq/L (ref 96–112)
GFR, Est African American: >89 mL/min
GLUCOSE: 141 mg/dL — AB (ref 70–99)
Potassium: 4.4 mEq/L (ref 3.5–5.3)
Sodium: 139 mEq/L (ref 135–145)

## 2014-10-30 LAB — LIPID PANEL
CHOLESTEROL: 169 mg/dL (ref 0–200)
HDL: 71 mg/dL (ref >=40)
LDL Cholesterol: 86 mg/dL (ref 0–99)
TRIGLYCERIDES: 61 mg/dL (ref <150)
Total CHOL/HDL Ratio: 2.4 Ratio
VLDL: 12 mg/dL (ref 0–40)

## 2014-10-30 LAB — MAGNESIUM: MAGNESIUM: 2 mg/dL (ref 1.5–2.5)

## 2014-10-30 LAB — TSH: TSH: 1.23 u[IU]/mL (ref 0.350–4.500)

## 2014-10-30 NOTE — Patient Instructions (Signed)
Nicotine Addiction Nicotine can act as both a stimulant (excites/activates) and a sedative (calms/quiets). Immediately after exposure to nicotine, there is a "kick" caused in part by the drug's stimulation of the adrenal glands and resulting discharge of adrenaline (epinephrine). The rush of adrenaline stimulates the body and causes a sudden release of sugar. This means that smokers are always slightly hyperglycemic. Hyperglycemic means that the blood sugar is high, just like in diabetics. Nicotine also decreases the amount of insulin which helps control sugar levels in the body. There is an increase in blood pressure, breathing, and the rate of heart beats.  In addition, nicotine indirectly causes a release of dopamine in the brain that controls pleasure and motivation. A similar reaction is seen with other drugs of abuse, such as cocaine and heroin. This dopamine release is thought to cause the pleasurable sensations when smoking. In some different cases, nicotine can also create a calming effect, depending on sensitivity of the smoker's nervous system and the dose of nicotine taken. WHAT HAPPENS WHEN NICOTINE IS TAKEN FOR LONG PERIODS OF TIME?  Long-term use of nicotine results in addiction. It is difficult to stop.  Repeated use of nicotine creates tolerance. Higher doses of nicotine are needed to get the "kick." When nicotine use is stopped, withdrawal may last a month or more. Withdrawal may begin within a few hours after the last cigarette. Symptoms peak within the first few days and may lessen within a few weeks. For some people, however, symptoms may last for months or longer. Withdrawal symptoms include:   Irritability.  Craving.  Learning and attention deficits.  Sleep disturbances.  Increased appetite. Craving for tobacco may last for 6 months or longer. Many behaviors done while using nicotine can also play a part in the severity of withdrawal symptoms. For some people, the feel,  smell, and sight of a cigarette and the ritual of obtaining, handling, lighting, and smoking the cigarette are closely linked with the pleasure of smoking. When stopped, they also miss the related behaviors which make the withdrawal or craving worse. While nicotine gum and patches may lessen the drug aspects of withdrawal, cravings often persist. WHAT ARE THE MEDICAL CONSEQUENCES OF NICOTINE USE?  Nicotine addiction accounts for one-third of all cancers. The top cancer caused by tobacco is lung cancer. Lung cancer is the number one cancer killer of both men and women.  Smoking is also associated with cancers of the:  Mouth.  Pharynx.  Larynx.  Esophagus.  Stomach.  Pancreas.  Cervix.  Kidney.  Ureter.  Bladder.  Smoking also causes lung diseases such as lasting (chronic) bronchitis and emphysema.  It worsens asthma in adults and children.  Smoking increases the risk of heart disease, including:  Stroke.  Heart attack.  Vascular disease.  Aneurysm.  Passive or secondary smoke can also increase medical risks including:  Asthma in children.  Sudden Infant Death Syndrome (SIDS).  Additionally, dropped cigarettes are the leading cause of residential fire fatalities.  Nicotine poisoning has been reported from accidental ingestion of tobacco products by children and pets. Death usually results in a few minutes from respiratory failure (when a person stops breathing) caused by paralysis. TREATMENT   Medication. Nicotine replacement medicines such as nicotine gum and the patch are used to stop smoking. These medicines gradually lower the dosage of nicotine in the body. These medicines do not contain the carbon monoxide and other toxins found in tobacco smoke.  Hypnotherapy.  Relaxation therapy.  Nicotine Anonymous (a 12-step support   program). Find times and locations in your local yellow pages. Document Released: 01/14/2004 Document Revised: 08/02/2011 Document  Reviewed: 07/06/2013 Exit[employer] Patient Information 2015 [employer]. This information is not intended to replace advice given to you by your health care provider. Make sure you discuss any questions you have with your health care provider.  

## 2014-10-30 NOTE — Progress Notes (Signed)
Patient ID: Michael Fields, male   DOB: [birth year], 64 y.o.   MRN: [id number]  Assessment and Plan:  Hypertension:  -Continue medication,  -monitor blood pressure at home.  -Continue DASH diet.   -Reminder to go to the ER if any CP, SOB, nausea, dizziness, severe HA, changes vision/speech, left arm numbness and tingling, and jaw pain.  Cholesterol: -Continue diet and exercise.  -Check cholesterol.   Pre-diabetes: -Continue diet and exercise.  -Check A1C  Vitamin D Def: -check level -continue medications.   Tobacco Abuse -patient not ready to quit -patient told to let us know and we can discuss medications to help quit smoking.  Continue diet and meds as discussed. Further disposition pending results of labs. Told patient to gradually increase his walking as he is deconditioned from his hip surgery.   HPI 64 y.o. male  presents for 3 month follow up with hypertension, hyperlipidemia, prediabetes and vitamin D.   His blood pressure has not been controlled at home, today their BP is BP: 138/76 mmHg.   He does not workout. He denies chest pain, shortness of breath, dizziness.  He reports that he has occasional dizziness especially when he stands quickly and then goes away after a few seconds.   He is not on cholesterol medication and denies myalgias. His cholesterol is not at goal. The cholesterol last visit was:   Lab Results  Component Value Date   CHOL 172 05/14/2014   HDL 55 05/14/2014   LDLCALC 107* 05/14/2014   TRIG 50 05/14/2014   CHOLHDL 3.1 05/14/2014     He has not been working on diet and exercise for prediabetes, and denies foot ulcerations, hyperglycemia, hypoglycemia , increased appetite, nausea, paresthesia of the feet, polydipsia, polyuria, visual disturbances, vomiting and weight loss. Last A1C in the office was:  Lab Results  Component Value Date   HGBA1C 5.7* 05/14/2014  He reports does eat a lot of white bread.  He also likes to have weenies and beans.     Patient is on Vitamin D supplement.  Lab Results  Component Value Date   VD25OH 34 05/14/2014      Current Medications:  Current Outpatient Prescriptions on File Prior to Visit  Medication Sig Dispense Refill  . bisoprolol-hydrochlorothiazide (ZIAC) 5-6.25 MG per tablet Take 1 tablet by mouth daily.    . Cholecalciferol (VITAMIN D PO) Take 1,000 Units by mouth. Taking 2000 to 3000 units daily    . oxyCODONE-acetaminophen (PERCOCET/ROXICET) 5-325 MG per tablet Take 1-2 tablets by mouth every 6 (six) hours as needed for moderate pain or severe pain. (Patient not taking: Reported on 10/30/2014) 90 tablet 0   No current facility-administered medications on file prior to visit.    Medical History:  Past Medical History  Diagnosis Date  . Wears dentures     top  . Wears glasses     readers  . Alcohol dependence   . Nicotine dependence     Allergies: No Known Allergies   Review of Systems:  Review of Systems  Constitutional: Negative for fever, chills, weight loss and malaise/fatigue.  HENT: Negative for congestion, ear pain and sore throat.   Eyes: Negative.   Respiratory: Positive for cough (Smoker). Negative for shortness of breath and wheezing.   Cardiovascular: Negative for chest pain, palpitations and leg swelling.  Gastrointestinal: Negative for heartburn, nausea, vomiting, diarrhea, constipation, blood in stool and melena.  Genitourinary: Positive for frequency. Negative for dysuria and urgency.  Skin: Negative.   Neurological:  Positive for dizziness. Negative for tingling, tremors, sensory change and headaches.  Psychiatric/Behavioral: Negative for depression. The patient is not nervous/anxious and does not have insomnia.     Family history- Review and unchanged  Social history- Review and unchanged  Physical Exam: BP 138/76 mmHg  Pulse 80  Temp(Src) 98.2 F (36.8 C) (Temporal)  Resp 16  Ht 5' 10.25" (1.784 m)  Wt 176 lb (79.833 kg)  BMI 25.08 kg/m2  SpO2  96% Wt Readings from Last 3 Encounters:  10/30/14 176 lb (79.833 kg)  07/25/14 173 lb 3.2 oz (78.563 kg)  06/24/14 180 lb 6.4 oz (81.829 kg)    General Appearance: Well nourished well developed, in no apparent distress. Eyes: PERRLA, EOMs, conjunctiva no swelling or erythema ENT/Mouth: Ear canals normal without obstruction, swelling, erythma, discharge.  TMs normal bilaterally.  Oropharynx moist, clear, without exudate, or postoropharyngeal swelling. Neck: Supple, thyroid normal,no cervical adenopathy  Respiratory: Respiratory effort normal, Breath sounds clear A&P without rhonchi, wheeze, or rale.  No retractions, no accessory usage. Cardio: RRR with no MRGs. Brisk peripheral pulses without edema.  1+ PT and DP pulses bilaterally.  Abdomen: Soft, + BS,  Non tender, no guarding, rebound, hernias, masses. Musculoskeletal: Full ROM, 5/5 strength, Normal gait Skin: Warm, dry without rashes, lesions, ecchymosis.  Neuro: Awake and oriented X 3, Cranial nerves intact. Normal muscle tone, no cerebellar symptoms. Psych: Normal affect, Insight and Judgment appropriate.    FORCUCCI, Marisella Puccio, PA-C 9:55 AM [facility]

## 2014-10-31 LAB — INSULIN, RANDOM: Insulin: 23.7 u[IU]/mL — ABNORMAL HIGH (ref 2.0–19.6)

## 2014-10-31 LAB — HEMOGLOBIN A1C
Hgb A1c MFr Bld: 5.5 % (ref <5.7)
Mean Plasma Glucose: 111 mg/dL (ref <117)

## 2014-11-02 LAB — VITAMIN D 1,25 DIHYDROXY
Vitamin D 1, 25 (OH)2 Total: 64 pg/mL (ref 18–72)
Vitamin D3 1, 25 (OH)2: 64 pg/mL

## 2015-02-05 ENCOUNTER — Other Ambulatory Visit: Payer: Self-pay | Admitting: *Deleted

## 2015-02-05 ENCOUNTER — Encounter: Payer: Self-pay | Admitting: Internal Medicine

## 2015-02-05 ENCOUNTER — Ambulatory Visit (INDEPENDENT_AMBULATORY_CARE_PROVIDER_SITE_OTHER): Payer: Medicare Other | Admitting: Internal Medicine

## 2015-02-05 VITALS — BP 122/70 | HR 60 | Temp 97.7°F | Resp 16 | Ht 70.25 in | Wt 177.2 lb

## 2015-02-05 DIAGNOSIS — R7309 Other abnormal glucose: Secondary | ICD-10-CM | POA: Diagnosis not present

## 2015-02-05 DIAGNOSIS — Z79899 Other long term (current) drug therapy: Secondary | ICD-10-CM | POA: Diagnosis not present

## 2015-02-05 DIAGNOSIS — E559 Vitamin D deficiency, unspecified: Secondary | ICD-10-CM

## 2015-02-05 DIAGNOSIS — E782 Mixed hyperlipidemia: Secondary | ICD-10-CM

## 2015-02-05 DIAGNOSIS — R7303 Prediabetes: Secondary | ICD-10-CM

## 2015-02-05 DIAGNOSIS — Z6825 Body mass index (BMI) 25.0-25.9, adult: Secondary | ICD-10-CM

## 2015-02-05 DIAGNOSIS — E663 Overweight: Secondary | ICD-10-CM | POA: Insufficient documentation

## 2015-02-05 DIAGNOSIS — I10 Essential (primary) hypertension: Secondary | ICD-10-CM

## 2015-02-05 LAB — CBC WITH DIFFERENTIAL/PLATELET
BASOS PCT: 1 % (ref 0–1)
Basophils Absolute: 0.1 10*3/uL (ref 0.0–0.1)
Eosinophils Absolute: 0.1 10*3/uL (ref 0.0–0.7)
Eosinophils Relative: 1 % (ref 0–5)
HEMATOCRIT: 46.3 % (ref 39.0–52.0)
Hemoglobin: 15.9 g/dL (ref 13.0–17.0)
Lymphocytes Relative: 32 % (ref 12–46)
Lymphs Abs: 2 10*3/uL (ref 0.7–4.0)
MCH: 31.4 pg (ref 26.0–34.0)
MCHC: 34.3 g/dL (ref 30.0–36.0)
MCV: 91.5 fL (ref 78.0–100.0)
MPV: 11.7 fL (ref 8.6–12.4)
Monocytes Absolute: 0.5 10*3/uL (ref 0.1–1.0)
Monocytes Relative: 8 % (ref 3–12)
NEUTROS ABS: 3.6 10*3/uL (ref 1.7–7.7)
NEUTROS PCT: 58 % (ref 43–77)
Platelets: 164 10*3/uL (ref 150–400)
RBC: 5.06 MIL/uL (ref 4.22–5.81)
RDW: 14 % (ref 11.5–15.5)
WBC: 6.2 10*3/uL (ref 4.0–10.5)

## 2015-02-05 LAB — BASIC METABOLIC PANEL WITH GFR
BUN: 16 mg/dL (ref 7–25)
CHLORIDE: 100 mmol/L (ref 98–110)
CO2: 26 mmol/L (ref 20–31)
Calcium: 9.7 mg/dL (ref 8.6–10.3)
Creat: 0.97 mg/dL (ref 0.70–1.25)
GFR, EST NON AFRICAN AMERICAN: 82 mL/min (ref >=60)
GFR, Est African American: >89 mL/min (ref >=60)
GLUCOSE: 134 mg/dL — AB (ref 65–99)
Potassium: 4 mmol/L (ref 3.5–5.3)
SODIUM: 140 mmol/L (ref 135–146)

## 2015-02-05 LAB — LIPID PANEL
CHOL/HDL RATIO: 3 ratio (ref <=5.0)
Cholesterol: 180 mg/dL (ref 125–200)
HDL: 61 mg/dL (ref >=40)
LDL Cholesterol: 91 mg/dL (ref <130)
Triglycerides: 141 mg/dL (ref <150)
VLDL: 28 mg/dL (ref <30)

## 2015-02-05 LAB — MAGNESIUM: Magnesium: 2 mg/dL (ref 1.5–2.5)

## 2015-02-05 LAB — HEPATIC FUNCTION PANEL
ALT: 16 U/L (ref 9–46)
AST: 21 U/L (ref 10–35)
Albumin: 4.5 g/dL (ref 3.6–5.1)
Alkaline Phosphatase: 57 U/L (ref 40–115)
BILIRUBIN INDIRECT: 0.6 mg/dL (ref 0.2–1.2)
Bilirubin, Direct: 0.2 mg/dL (ref <=0.2)
TOTAL PROTEIN: 6.6 g/dL (ref 6.1–8.1)
Total Bilirubin: 0.8 mg/dL (ref 0.2–1.2)

## 2015-02-05 LAB — TSH: TSH: 1.466 u[IU]/mL (ref 0.350–4.500)

## 2015-02-05 MED ORDER — BISOPROLOL-HYDROCHLOROTHIAZIDE 5-6.25 MG PO TABS: 1.0000 | ORAL_TABLET | Freq: Every day | ORAL | Status: DC

## 2015-02-05 NOTE — Patient Instructions (Signed)
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
## 2015-02-06 LAB — INSULIN, RANDOM: Insulin: 24.7 u[IU]/mL — ABNORMAL HIGH (ref 2.0–19.6)

## 2015-02-06 LAB — HEMOGLOBIN A1C
HEMOGLOBIN A1C: 5.9 % — AB (ref <5.7)
MEAN PLASMA GLUCOSE: 123 mg/dL — AB (ref <117)

## 2015-02-06 LAB — VITAMIN D 25 HYDROXY (VIT D DEFICIENCY, FRACTURES): Vit D, 25-Hydroxy: 39 ng/mL (ref 30–100)

## 2015-04-04 ENCOUNTER — Ambulatory Visit (INDEPENDENT_AMBULATORY_CARE_PROVIDER_SITE_OTHER): Payer: Medicare Other | Admitting: Internal Medicine

## 2015-04-04 ENCOUNTER — Encounter: Payer: Self-pay | Admitting: Internal Medicine

## 2015-04-04 VITALS — BP 130/84 | HR 60 | Temp 98.6°F | Resp 16 | Ht 70.25 in | Wt 179.0 lb

## 2015-04-04 DIAGNOSIS — I10 Essential (primary) hypertension: Secondary | ICD-10-CM

## 2015-04-04 DIAGNOSIS — H05811 Cyst of right orbit: Secondary | ICD-10-CM | POA: Diagnosis not present

## 2015-04-04 DIAGNOSIS — L723 Sebaceous cyst: Secondary | ICD-10-CM | POA: Diagnosis not present

## 2015-04-05 ENCOUNTER — Encounter: Payer: Self-pay | Admitting: Internal Medicine
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
## 2015-04-09 ENCOUNTER — Ambulatory Visit: Payer: Medicare Other | Admitting: Internal Medicine

## 2015-04-09 ENCOUNTER — Encounter: Payer: Self-pay | Admitting: Internal Medicine

## 2015-04-09 VITALS — BP 122/66 | HR 64 | Temp 98.1°F | Resp 16 | Ht 70.25 in | Wt 183.0 lb

## 2015-04-09 DIAGNOSIS — L723 Sebaceous cyst: Secondary | ICD-10-CM

## 2015-04-09 NOTE — Progress Notes (Signed)
  Subjective:    Patient ID: Michael Fields, male    DOB: 1950-07-14, 64 y.o.   MRN: [id number]  HPI Patient is s/p excision of a large Sebaceous cyst of the back 5 days ago. Denies problems, fever, etc.  Review of Systems    Objective:   Physical Exam  BP 122/66 mmHg  Pulse 64  Temp(Src) 98.1 F (36.7 C)  Resp 16  Ht 5' 10.25" (1.784 m)  Wt 183 lb (83.008 kg)  BMI 26.08 kg/m2  Wound site appears clean w/o signs of infection. #4 Everting vertical mattress suture removed and running suture remains.     Assessment & Plan:   1) s/p excision sebaceous cyst of left upper back  - ROV 5-7 days for remainder suture removal.

## 2015-04-14 ENCOUNTER — Ambulatory Visit: Payer: Medicare Other | Admitting: Internal Medicine

## 2015-04-14 ENCOUNTER — Encounter: Payer: Self-pay | Admitting: Internal Medicine

## 2015-04-14 VITALS — BP 116/68 | HR 60 | Temp 97.5°F | Resp 16 | Ht 70.25 in | Wt 181.4 lb

## 2015-04-14 DIAGNOSIS — L723 Sebaceous cyst: Secondary | ICD-10-CM

## 2015-04-14 NOTE — Progress Notes (Signed)
   Subjective:    Patient ID: Michael Fields, male    DOB: 1951/01/11, 64 y.o.   MRN: [id number]  HPI f/u excision sebaceous cyst of back  Review of Systems    Objective:   Physical Exam  BP 116/68 mmHg  Pulse 60  Temp(Src) 97.5 F (36.4 C)  Resp 16  Ht 5' 10.25" (1.784 m)  Wt 181 lb 6.4 oz (82.283 kg)  BMI 25.85 kg/m2  Wound well healed - sutures removed     Assessment & Plan:  1) Sebaceous cyst , post excision

## 2015-05-21 ENCOUNTER — Encounter: Payer: Self-pay | Admitting: Internal Medicine

## 2015-07-01 ENCOUNTER — Ambulatory Visit (INDEPENDENT_AMBULATORY_CARE_PROVIDER_SITE_OTHER): Payer: Medicare Other | Admitting: Internal Medicine

## 2015-07-01 ENCOUNTER — Encounter: Payer: Self-pay | Admitting: Internal Medicine

## 2015-07-01 VITALS — BP 124/78 | HR 56 | Temp 97.9°F | Resp 14 | Ht 71.0 in | Wt 185.0 lb

## 2015-07-01 DIAGNOSIS — Z79899 Other long term (current) drug therapy: Secondary | ICD-10-CM

## 2015-07-01 DIAGNOSIS — Z1389 Encounter for screening for other disorder: Secondary | ICD-10-CM | POA: Diagnosis not present

## 2015-07-01 DIAGNOSIS — Z1212 Encounter for screening for malignant neoplasm of rectum: Secondary | ICD-10-CM

## 2015-07-01 DIAGNOSIS — E782 Mixed hyperlipidemia: Secondary | ICD-10-CM

## 2015-07-01 DIAGNOSIS — R945 Abnormal results of liver function studies: Secondary | ICD-10-CM

## 2015-07-01 DIAGNOSIS — R799 Abnormal finding of blood chemistry, unspecified: Secondary | ICD-10-CM | POA: Diagnosis not present

## 2015-07-01 DIAGNOSIS — I10 Essential (primary) hypertension: Secondary | ICD-10-CM | POA: Diagnosis not present

## 2015-07-01 DIAGNOSIS — E559 Vitamin D deficiency, unspecified: Secondary | ICD-10-CM | POA: Diagnosis not present

## 2015-07-01 DIAGNOSIS — Z1331 Encounter for screening for depression: Secondary | ICD-10-CM

## 2015-07-01 DIAGNOSIS — Z125 Encounter for screening for malignant neoplasm of prostate: Secondary | ICD-10-CM

## 2015-07-01 DIAGNOSIS — R7989 Other specified abnormal findings of blood chemistry: Secondary | ICD-10-CM

## 2015-07-01 DIAGNOSIS — R7303 Prediabetes: Secondary | ICD-10-CM | POA: Diagnosis not present

## 2015-07-01 DIAGNOSIS — N320 Bladder-neck obstruction: Secondary | ICD-10-CM | POA: Diagnosis not present

## 2015-07-01 DIAGNOSIS — R7309 Other abnormal glucose: Secondary | ICD-10-CM

## 2015-07-01 LAB — CBC WITH DIFFERENTIAL/PLATELET
BASOS PCT: 0 % (ref 0–1)
Basophils Absolute: 0 10*3/uL (ref 0.0–0.1)
EOS ABS: 0.1 10*3/uL (ref 0.0–0.7)
Eosinophils Relative: 1 % (ref 0–5)
HCT: 46.8 % (ref 39.0–52.0)
HEMOGLOBIN: 15.6 g/dL (ref 13.0–17.0)
Lymphocytes Relative: 23 % (ref 12–46)
Lymphs Abs: 1.7 10*3/uL (ref 0.7–4.0)
MCH: 31.3 pg (ref 26.0–34.0)
MCHC: 33.3 g/dL (ref 30.0–36.0)
MCV: 93.8 fL (ref 78.0–100.0)
MONO ABS: 0.5 10*3/uL (ref 0.1–1.0)
MONOS PCT: 7 % (ref 3–12)
MPV: 11.7 fL (ref 8.6–12.4)
Neutro Abs: 5.1 10*3/uL (ref 1.7–7.7)
Neutrophils Relative %: 69 % (ref 43–77)
PLATELETS: 206 10*3/uL (ref 150–400)
RBC: 4.99 MIL/uL (ref 4.22–5.81)
RDW: 13.6 % (ref 11.5–15.5)
WBC: 7.4 10*3/uL (ref 4.0–10.5)

## 2015-07-01 LAB — TSH: TSH: 1.24 m[IU]/L (ref 0.40–4.50)

## 2015-07-01 NOTE — Patient Instructions (Signed)
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
## 2015-07-02 LAB — HEPATITIS C ANTIBODY: HCV Ab: NEGATIVE

## 2015-07-02 LAB — BASIC METABOLIC PANEL WITH GFR
BUN: 15 mg/dL (ref 7–25)
CALCIUM: 9.4 mg/dL (ref 8.6–10.3)
CO2: 29 mmol/L (ref 20–31)
CREATININE: 1.04 mg/dL (ref 0.70–1.25)
Chloride: 101 mmol/L (ref 98–110)
GFR, Est African American: 86 mL/min (ref >=60)
GFR, Est Non African American: 74 mL/min (ref >=60)
Glucose, Bld: 143 mg/dL — ABNORMAL HIGH (ref 65–99)
Potassium: 4.2 mmol/L (ref 3.5–5.3)
SODIUM: 140 mmol/L (ref 135–146)

## 2015-07-02 LAB — LIPID PANEL
Cholesterol: 206 mg/dL — ABNORMAL HIGH (ref 125–200)
HDL: 62 mg/dL (ref >=40)
LDL Cholesterol: 115 mg/dL (ref <130)
Total CHOL/HDL Ratio: 3.3 Ratio (ref <=5.0)
Triglycerides: 147 mg/dL (ref <150)
VLDL: 29 mg/dL (ref <30)

## 2015-07-02 LAB — HEPATIC FUNCTION PANEL
ALBUMIN: 4.6 g/dL (ref 3.6–5.1)
ALT: 16 U/L (ref 9–46)
AST: 21 U/L (ref 10–35)
Alkaline Phosphatase: 53 U/L (ref 40–115)
BILIRUBIN DIRECT: 0.1 mg/dL (ref <=0.2)
Indirect Bilirubin: 0.5 mg/dL (ref 0.2–1.2)
TOTAL PROTEIN: 6.8 g/dL (ref 6.1–8.1)
Total Bilirubin: 0.6 mg/dL (ref 0.2–1.2)

## 2015-07-02 LAB — URINALYSIS, MICROSCOPIC ONLY
Bacteria, UA: NONE SEEN [HPF]
CRYSTALS: NONE SEEN [HPF]
Casts: NONE SEEN [LPF]
RBC / HPF: NONE SEEN RBC/HPF (ref <=2)
Squamous Epithelial / LPF: NONE SEEN [HPF] (ref <=5)
WBC UA: NONE SEEN WBC/HPF (ref <=5)
Yeast: NONE SEEN [HPF]

## 2015-07-02 LAB — INSULIN, RANDOM: INSULIN: 48.4 u[IU]/mL — AB (ref 2.0–19.6)

## 2015-07-02 LAB — PSA: PSA: 2.71 ng/mL (ref <=4.00)

## 2015-07-02 LAB — HEPATITIS B SURFACE ANTIBODY,QUALITATIVE: Hep B S Ab: NEGATIVE

## 2015-07-02 LAB — MAGNESIUM: MAGNESIUM: 2 mg/dL (ref 1.5–2.5)

## 2015-07-02 LAB — HEMOGLOBIN A1C
HEMOGLOBIN A1C: 5.8 % — AB (ref <5.7)
Mean Plasma Glucose: 120 mg/dL — ABNORMAL HIGH (ref <117)

## 2015-07-02 LAB — MICROALBUMIN / CREATININE URINE RATIO
CREATININE, URINE: 71 mg/dL (ref 20–370)
MICROALB/CREAT RATIO: 7 ug/mg{creat} (ref <30)
Microalb, Ur: 0.5 mg/dL

## 2015-07-02 LAB — HEPATITIS B CORE ANTIBODY, TOTAL: Hep B Core Total Ab: NONREACTIVE

## 2015-07-02 LAB — HEPATITIS A ANTIBODY, TOTAL: Hep A Total Ab: NONREACTIVE

## 2015-07-02 LAB — URINALYSIS, ROUTINE W REFLEX MICROSCOPIC
BILIRUBIN URINE: NEGATIVE
Glucose, UA: NEGATIVE
HGB URINE DIPSTICK: NEGATIVE
KETONES UR: NEGATIVE
Nitrite: NEGATIVE
PROTEIN: NEGATIVE
Specific Gravity, Urine: 1.012 (ref 1.001–1.035)
pH: 6 (ref 5.0–8.0)

## 2015-07-02 LAB — VITAMIN D 25 HYDROXY (VIT D DEFICIENCY, FRACTURES): VIT D 25 HYDROXY: 44 ng/mL (ref 30–100)

## 2015-07-03 LAB — HEPATITIS B E ANTIBODY: HEPATITIS BE ANTIBODY: NONREACTIVE

## 2015-07-21 ENCOUNTER — Other Ambulatory Visit: Payer: Self-pay | Admitting: *Deleted

## 2015-07-21 DIAGNOSIS — Z1212 Encounter for screening for malignant neoplasm of rectum: Secondary | ICD-10-CM

## 2015-07-21 LAB — POC HEMOCCULT BLD/STL (HOME/3-CARD/SCREEN)
Card #2 Fecal Occult Blod, POC: NEGATIVE
FECAL OCCULT BLD: NEGATIVE
FECAL OCCULT BLD: NEGATIVE

## 2015-09-12 ENCOUNTER — Other Ambulatory Visit: Payer: Self-pay | Admitting: *Deleted

## 2015-09-12 MED ORDER — BISOPROLOL-HYDROCHLOROTHIAZIDE 5-6.25 MG PO TABS: 1.0000 | ORAL_TABLET | Freq: Every day | ORAL | Status: DC

## 2015-10-01 ENCOUNTER — Ambulatory Visit (INDEPENDENT_AMBULATORY_CARE_PROVIDER_SITE_OTHER): Payer: Medicare Other | Admitting: Internal Medicine

## 2015-10-01 ENCOUNTER — Encounter: Payer: Self-pay | Admitting: Internal Medicine

## 2015-10-01 VITALS — BP 118/64 | HR 66 | Temp 97.5°F | Resp 16 | Ht 71.0 in | Wt 185.0 lb

## 2015-10-01 DIAGNOSIS — E559 Vitamin D deficiency, unspecified: Secondary | ICD-10-CM | POA: Diagnosis not present

## 2015-10-01 DIAGNOSIS — R6889 Other general symptoms and signs: Secondary | ICD-10-CM

## 2015-10-01 DIAGNOSIS — R7303 Prediabetes: Secondary | ICD-10-CM

## 2015-10-01 DIAGNOSIS — F17210 Nicotine dependence, cigarettes, uncomplicated: Secondary | ICD-10-CM

## 2015-10-01 DIAGNOSIS — R7309 Other abnormal glucose: Secondary | ICD-10-CM | POA: Diagnosis not present

## 2015-10-01 DIAGNOSIS — I10 Essential (primary) hypertension: Secondary | ICD-10-CM

## 2015-10-01 DIAGNOSIS — Z23 Encounter for immunization: Secondary | ICD-10-CM

## 2015-10-01 DIAGNOSIS — Z0001 Encounter for general adult medical examination with abnormal findings: Secondary | ICD-10-CM

## 2015-10-01 DIAGNOSIS — Z79899 Other long term (current) drug therapy: Secondary | ICD-10-CM | POA: Diagnosis not present

## 2015-10-01 DIAGNOSIS — S32409D Unspecified fracture of unspecified acetabulum, subsequent encounter for fracture with routine healing: Secondary | ICD-10-CM

## 2015-10-01 DIAGNOSIS — F1010 Alcohol abuse, uncomplicated: Secondary | ICD-10-CM | POA: Diagnosis not present

## 2015-10-01 DIAGNOSIS — Z6825 Body mass index (BMI) 25.0-25.9, adult: Secondary | ICD-10-CM

## 2015-10-01 DIAGNOSIS — Z0000 Encounter for general adult medical examination without abnormal findings: Secondary | ICD-10-CM

## 2015-10-01 DIAGNOSIS — E782 Mixed hyperlipidemia: Secondary | ICD-10-CM | POA: Diagnosis not present

## 2015-10-01 LAB — HEPATIC FUNCTION PANEL
ALK PHOS: 53 U/L (ref 40–115)
ALT: 15 U/L (ref 9–46)
AST: 22 U/L (ref 10–35)
Albumin: 4.5 g/dL (ref 3.6–5.1)
BILIRUBIN DIRECT: 0.2 mg/dL (ref <=0.2)
BILIRUBIN INDIRECT: 0.5 mg/dL (ref 0.2–1.2)
TOTAL PROTEIN: 6.4 g/dL (ref 6.1–8.1)
Total Bilirubin: 0.7 mg/dL (ref 0.2–1.2)

## 2015-10-01 LAB — BASIC METABOLIC PANEL WITH GFR
BUN: 11 mg/dL (ref 7–25)
CO2: 28 mmol/L (ref 20–31)
Calcium: 9.4 mg/dL (ref 8.6–10.3)
Chloride: 102 mmol/L (ref 98–110)
Creat: 0.9 mg/dL (ref 0.70–1.25)
GFR, EST NON AFRICAN AMERICAN: 89 mL/min (ref >=60)
GLUCOSE: 127 mg/dL — AB (ref 65–99)
POTASSIUM: 5 mmol/L (ref 3.5–5.3)
SODIUM: 138 mmol/L (ref 135–146)

## 2015-10-01 LAB — CBC WITH DIFFERENTIAL/PLATELET
BASOS PCT: 0 %
Basophils Absolute: 0 cells/uL (ref 0–200)
EOS PCT: 1 %
Eosinophils Absolute: 69 cells/uL (ref 15–500)
HEMATOCRIT: 44 % (ref 38.5–50.0)
HEMOGLOBIN: 15 g/dL (ref 13.2–17.1)
LYMPHS ABS: 1794 {cells}/uL (ref 850–3900)
Lymphocytes Relative: 26 %
MCH: 31.8 pg (ref 27.0–33.0)
MCHC: 34.1 g/dL (ref 32.0–36.0)
MCV: 93.2 fL (ref 80.0–100.0)
MONO ABS: 621 {cells}/uL (ref 200–950)
MPV: 11.5 fL (ref 7.5–12.5)
Monocytes Relative: 9 %
Neutro Abs: 4416 cells/uL (ref 1500–7800)
Neutrophils Relative %: 64 %
Platelets: 190 10*3/uL (ref 140–400)
RBC: 4.72 MIL/uL (ref 4.20–5.80)
RDW: 13.5 % (ref 11.0–15.0)
WBC: 6.9 10*3/uL (ref 3.8–10.8)

## 2015-10-01 LAB — LIPID PANEL
CHOL/HDL RATIO: 3.3 ratio (ref <=5.0)
Cholesterol: 170 mg/dL (ref 125–200)
HDL: 51 mg/dL (ref >=40)
LDL CALC: 97 mg/dL (ref <130)
TRIGLYCERIDES: 110 mg/dL (ref <150)
VLDL: 22 mg/dL (ref <30)

## 2015-10-01 LAB — TSH: TSH: 0.86 mIU/L (ref 0.40–4.50)

## 2015-10-01 LAB — HEMOGLOBIN A1C
HEMOGLOBIN A1C: 5.8 % — AB (ref <5.7)
Mean Plasma Glucose: 120 mg/dL
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
## 2016-01-06 ENCOUNTER — Ambulatory Visit (INDEPENDENT_AMBULATORY_CARE_PROVIDER_SITE_OTHER): Payer: Medicare Other | Admitting: Internal Medicine

## 2016-01-06 ENCOUNTER — Encounter: Payer: Self-pay | Admitting: Internal Medicine

## 2016-01-06 VITALS — BP 120/72 | HR 64 | Temp 97.5°F | Resp 16 | Ht 71.0 in | Wt 180.0 lb

## 2016-01-06 DIAGNOSIS — K644 Residual hemorrhoidal skin tags: Secondary | ICD-10-CM

## 2016-01-06 DIAGNOSIS — Z79899 Other long term (current) drug therapy: Secondary | ICD-10-CM | POA: Diagnosis not present

## 2016-01-06 DIAGNOSIS — R7303 Prediabetes: Secondary | ICD-10-CM | POA: Diagnosis not present

## 2016-01-06 DIAGNOSIS — K648 Other hemorrhoids: Secondary | ICD-10-CM | POA: Diagnosis not present

## 2016-01-06 DIAGNOSIS — E782 Mixed hyperlipidemia: Secondary | ICD-10-CM

## 2016-01-06 DIAGNOSIS — E559 Vitamin D deficiency, unspecified: Secondary | ICD-10-CM

## 2016-01-06 DIAGNOSIS — I10 Essential (primary) hypertension: Secondary | ICD-10-CM | POA: Diagnosis not present

## 2016-01-06 DIAGNOSIS — Z23 Encounter for immunization: Secondary | ICD-10-CM | POA: Diagnosis not present

## 2016-01-06 LAB — BASIC METABOLIC PANEL WITH GFR
BUN: 11 mg/dL (ref 7–25)
CHLORIDE: 103 mmol/L (ref 98–110)
CO2: 26 mmol/L (ref 20–31)
Calcium: 9.8 mg/dL (ref 8.6–10.3)
Creat: 0.93 mg/dL (ref 0.70–1.25)
GFR, Est African American: >89 mL/min (ref >=60)
GFR, Est Non African American: 85 mL/min (ref >=60)
GLUCOSE: 91 mg/dL (ref 65–99)
POTASSIUM: 4 mmol/L (ref 3.5–5.3)
Sodium: 139 mmol/L (ref 135–146)

## 2016-01-06 LAB — CBC WITH DIFFERENTIAL/PLATELET
BASOS PCT: 0 %
Basophils Absolute: 0 cells/uL (ref 0–200)
EOS ABS: 146 {cells}/uL (ref 15–500)
Eosinophils Relative: 2 %
HEMATOCRIT: 48.9 % (ref 38.5–50.0)
HEMOGLOBIN: 16.8 g/dL (ref 13.2–17.1)
LYMPHS ABS: 2263 {cells}/uL (ref 850–3900)
Lymphocytes Relative: 31 %
MCH: 32.2 pg (ref 27.0–33.0)
MCHC: 34.4 g/dL (ref 32.0–36.0)
MCV: 93.9 fL (ref 80.0–100.0)
MONO ABS: 730 {cells}/uL (ref 200–950)
MPV: 11.3 fL (ref 7.5–12.5)
Monocytes Relative: 10 %
NEUTROS ABS: 4161 {cells}/uL (ref 1500–7800)
Neutrophils Relative %: 57 %
PLATELETS: 206 10*3/uL (ref 140–400)
RBC: 5.21 MIL/uL (ref 4.20–5.80)
RDW: 13.5 % (ref 11.0–15.0)
WBC: 7.3 10*3/uL (ref 3.8–10.8)

## 2016-01-06 LAB — MAGNESIUM: Magnesium: 2 mg/dL (ref 1.5–2.5)

## 2016-01-06 LAB — LIPID PANEL
Cholesterol: 196 mg/dL (ref 125–200)
HDL: 60 mg/dL (ref >=40)
LDL CALC: 100 mg/dL (ref <130)
Total CHOL/HDL Ratio: 3.3 Ratio (ref <=5.0)
Triglycerides: 178 mg/dL — ABNORMAL HIGH (ref <150)
VLDL: 36 mg/dL — ABNORMAL HIGH (ref <30)

## 2016-01-06 LAB — HEPATIC FUNCTION PANEL
ALBUMIN: 4.7 g/dL (ref 3.6–5.1)
ALK PHOS: 59 U/L (ref 40–115)
ALT: 15 U/L (ref 9–46)
AST: 20 U/L (ref 10–35)
BILIRUBIN DIRECT: 0.2 mg/dL (ref <=0.2)
BILIRUBIN INDIRECT: 0.5 mg/dL (ref 0.2–1.2)
BILIRUBIN TOTAL: 0.7 mg/dL (ref 0.2–1.2)
TOTAL PROTEIN: 7.3 g/dL (ref 6.1–8.1)

## 2016-01-06 LAB — TSH: TSH: 2.56 m[IU]/L (ref 0.40–4.50)

## 2016-01-06 MED ORDER — HYDROCORTISONE 2.5 % RE CREA: TOPICAL_CREAM | RECTAL | 3 refills | Status: DC

## 2016-01-06 NOTE — Patient Instructions (Signed)
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
## 2016-01-07 LAB — HEMOGLOBIN A1C
Hgb A1c MFr Bld: 5.3 % (ref <5.7)
MEAN PLASMA GLUCOSE: 105 mg/dL

## 2016-01-07 LAB — VITAMIN D 25 HYDROXY (VIT D DEFICIENCY, FRACTURES): Vit D, 25-Hydroxy: 42 ng/mL (ref 30–100)

## 2016-01-07 LAB — INSULIN, RANDOM: INSULIN: 4 u[IU]/mL (ref 2.0–19.6)

## 2016-01-13 DIAGNOSIS — Z1211 Encounter for screening for malignant neoplasm of colon: Secondary | ICD-10-CM | POA: Diagnosis not present

## 2016-01-13 DIAGNOSIS — Z1212 Encounter for screening for malignant neoplasm of rectum: Secondary | ICD-10-CM | POA: Diagnosis not present

## 2016-01-13 LAB — COLOGUARD

## 2016-01-21 DIAGNOSIS — K648 Other hemorrhoids: Secondary | ICD-10-CM | POA: Diagnosis not present

## 2016-01-23 ENCOUNTER — Telehealth: Payer: Self-pay | Admitting: Internal Medicine

## 2016-01-23 ENCOUNTER — Other Ambulatory Visit: Payer: Self-pay | Admitting: Internal Medicine

## 2016-01-23 DIAGNOSIS — Z1211 Encounter for screening for malignant neoplasm of colon: Secondary | ICD-10-CM

## 2016-01-23 DIAGNOSIS — Z1212 Encounter for screening for malignant neoplasm of rectum: Principal | ICD-10-CM

## 2016-01-23 LAB — COLOGUARD

## 2016-01-23 NOTE — Telephone Encounter (Signed)
Per Dr Melford Aase, Advised patient that Cologuard was positive and referral to GI was being done.  Patient aware of cologuard results and instructions for colonoscopy screening with [facility]. Referral placed for colonoscopy screening with gastroenterology.

## 2016-01-27 ENCOUNTER — Ambulatory Visit (INDEPENDENT_AMBULATORY_CARE_PROVIDER_SITE_OTHER): Payer: Medicare Other | Admitting: Gastroenterology

## 2016-01-27 ENCOUNTER — Encounter: Payer: Self-pay | Admitting: Gastroenterology

## 2016-01-27 VITALS — BP 132/74 | HR 68 | Ht 70.25 in | Wt 180.2 lb

## 2016-01-27 DIAGNOSIS — R195 Other fecal abnormalities: Secondary | ICD-10-CM

## 2016-01-27 DIAGNOSIS — K644 Residual hemorrhoidal skin tags: Secondary | ICD-10-CM

## 2016-01-27 DIAGNOSIS — K648 Other hemorrhoids: Secondary | ICD-10-CM | POA: Diagnosis not present
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
## 2016-01-27 NOTE — Patient Instructions (Signed)
If you are age 67 or older, your body mass index should be between 23-30. Your Body mass index is 25.68 kg/m. If this is out of the aforementioned range listed, please consider follow up with your Primary Care Provider.  If you are age 52 or younger, your body mass index should be between 19-25. Your Body mass index is 25.68 kg/m. If this is out of the aformentioned range listed, please consider follow up with your Primary Care Provider.   You have been scheduled for a colonoscopy. Please follow written instructions given to you at your visit today.  Please pick up your prep supplies at the pharmacy within the next 1-3 days. If you use inhalers (even only as needed), please bring them with you on the day of your procedure.  Thank you for choosing [facility]  Dr Wilfrid Lund III

## 2016-02-13 DIAGNOSIS — K648 Other hemorrhoids: Secondary | ICD-10-CM | POA: Diagnosis not present

## 2016-02-26 ENCOUNTER — Ambulatory Visit (AMBULATORY_SURGERY_CENTER): Payer: Medicare Other | Admitting: Gastroenterology

## 2016-02-26 ENCOUNTER — Encounter: Payer: Self-pay | Admitting: Gastroenterology

## 2016-02-26 VITALS — BP 120/74 | HR 51 | Temp 98.7°F | Resp 16 | Ht 70.0 in | Wt 180.0 lb

## 2016-02-26 DIAGNOSIS — R195 Other fecal abnormalities: Secondary | ICD-10-CM

## 2016-02-26 DIAGNOSIS — D123 Benign neoplasm of transverse colon: Secondary | ICD-10-CM | POA: Diagnosis not present

## 2016-02-26 DIAGNOSIS — Z1211 Encounter for screening for malignant neoplasm of colon: Secondary | ICD-10-CM | POA: Diagnosis not present

## 2016-02-26 DIAGNOSIS — D122 Benign neoplasm of ascending colon: Secondary | ICD-10-CM | POA: Diagnosis not present

## 2016-02-26 DIAGNOSIS — D128 Benign neoplasm of rectum: Secondary | ICD-10-CM | POA: Diagnosis not present

## 2016-02-26 DIAGNOSIS — D129 Benign neoplasm of anus and anal canal: Secondary | ICD-10-CM

## 2016-02-26 DIAGNOSIS — K640 First degree hemorrhoids: Secondary | ICD-10-CM

## 2016-02-26 MED ORDER — SODIUM CHLORIDE 0.9 % IV SOLN: 500.0000 mL | INTRAVENOUS | Status: DC

## 2016-02-26 NOTE — Progress Notes (Signed)
A and O x3. Report to RN. Tolerated MAC anesthesia well. 

## 2016-02-26 NOTE — Op Note (Signed)
[name] Procedure Date: 02/26/2016 1:52 PM MRN: [id number] Endoscopist: Kleberg. Loletha Carrow , MD Age: 65 Referring MD:  Date of Birth: July 02, 1950 Gender: Male Account #: [id number] Procedure:                Colonoscopy Indications:              Positive Cologuard test Medicines:                Monitored Anesthesia Care Procedure:                Pre-Anesthesia Assessment:                           - Prior to the procedure, a History and Physical                            was performed, and patient medications and                            allergies were reviewed. The patient's tolerance of                            previous anesthesia was also reviewed. The risks                            and benefits of the procedure and the sedation                            options and risks were discussed with the patient.                            All questions were answered, and informed consent                            was obtained. Prior Anticoagulants: The patient has                            taken no previous anticoagulant or antiplatelet                            agents. ASA Grade Assessment: II - A patient with                            mild systemic disease. After reviewing the risks                            and benefits, the patient was deemed in                            satisfactory condition to undergo the procedure.                           After obtaining informed consent, the colonoscope  was passed under direct vision. Throughout the                            procedure, the patient's blood pressure, pulse, and                            oxygen saturations were monitored continuously. The                            Model CF-HQ190L [id number]) scope was introduced                            through the anus and advanced to the the cecum,                            identified by appendiceal orifice and  ileocecal                            valve. The colonoscopy was performed without                            difficulty. The patient tolerated the procedure                            well. The quality of the bowel preparation was                            good. The ileocecal valve, appendiceal orifice, and                            rectum were photographed. The bowel preparation                            used was SUPREP. Scope In: 1:58:57 PM Scope Out: 2:20:35 PM Scope Withdrawal Time: 0 hours 16 minutes 17 seconds  Total Procedure Duration: 0 hours 21 minutes 38 seconds  Findings:                 The perianal and digital rectal examinations were                            normal.                           A 6 mm polyp was found in the proximal ascending                            colon. The polyp was sessile. The polyp was removed                            with a hot snare. Resection and retrieval were                            complete.  Two sessile polyps were found in the hepatic                            flexure and distal ascending colon. The polyps were                            2 mm in size. These polyps were removed with a cold                            snare. Resection and retrieval were complete.                           A 12 mm polyp was found in the rectum. The polyp                            was pedunculated. The polyp was removed with a hot                            snare. Resection and retrieval were complete.                           Incidentally, an ulcer was seen in the rectum at                            the site of hemorrhoidal banding recently performed                            by a surgeon.                           Diverticula were found in the left colon and right                            colon.                           External and internal hemorrhoids were found during                            anoscopy. The  hemorrhoids were large and Grade I                            (internal hemorrhoids that do not prolapse).                           The exam was otherwise without abnormality. Complications:            No immediate complications. Estimated Blood Loss:     Estimated blood loss: none. Impression:               - One 6 mm polyp in the proximal ascending colon,                            removed with a hot snare. Resected  and retrieved.                           - Two 2 mm polyps at the hepatic flexure and in the                            distal ascending colon, removed with a cold snare.                            Resected and retrieved.                           - One 12 mm polyp in the rectum, removed with a hot                            snare. Resected and retrieved.                           - Diverticulosis in the left colon and in the right                            colon.                           - External and internal hemorrhoids.                           - The examination was otherwise normal. Recommendation:           - Patient has a contact number available for                            emergencies. The signs and symptoms of potential                            delayed complications were discussed with the                            patient. Return to normal activities tomorrow.                            Written discharge instructions were provided to the                            patient.                           - Resume previous diet.                           - No aspirin, ibuprofen, naproxen, or other                            non-steroidal anti-inflammatory drugs for 5 days  after polyp removal.                           - Await pathology results.                           - Repeat colonoscopy is recommended for                            surveillance. The colonoscopy date will be                            determined after pathology  results from today's                            exam become available for review. Mikya Don L. Loletha Carrow, MD 02/26/2016 [id number] PM This report has been signed electronically.

## 2016-02-26 NOTE — Progress Notes (Signed)
Called to room to assist during endoscopic procedure.  Patient ID and intended procedure confirmed with present staff. Received instructions for my participation in the procedure from the performing physician.  

## 2016-02-26 NOTE — Patient Instructions (Signed)
Impression/Recommendations:  Polyp handout given. Hemorrhoid handout given.  No aspirin, Ibuprofen, naproxen, or NSAIDS for 5 days.   Tylenol only until 03/03/2016.  YOU HAD AN ENDOSCOPIC PROCEDURE TODAY AT [facility]:   Refer to the procedure report that was given to you for any specific questions about what was found during the examination.  If the procedure report does not answer your questions, please call your gastroenterologist to clarify.  If you requested that your care partner not be given the details of your procedure findings, then the procedure report has been included in a sealed envelope for you to review at your convenience later.  YOU SHOULD EXPECT: Some feelings of bloating in the abdomen. Passage of more gas than usual.  Walking can help get rid of the air that was put into your GI tract during the procedure and reduce the bloating. If you had a lower endoscopy (such as a colonoscopy or flexible sigmoidoscopy) you may notice spotting of blood in your stool or on the toilet paper. If you underwent a bowel prep for your procedure, you may not have a normal bowel movement for a few days.  Please Note:  You might notice some irritation and congestion in your nose or some drainage.  This is from the oxygen used during your procedure.  There is no need for concern and it should clear up in a day or so.  SYMPTOMS TO REPORT IMMEDIATELY:   Following lower endoscopy (colonoscopy or flexible sigmoidoscopy):  Excessive amounts of blood in the stool  Significant tenderness or worsening of abdominal pains  Swelling of the abdomen that is new, acute  Fever of 100F or higher   Following upper endoscopy (EGD)  Vomiting of blood or coffee ground material  New chest pain or pain under the shoulder blades  Painful or persistently difficult swallowing  New shortness of breath  Fever of 100F or higher  Black, tarry-looking stools  For urgent or emergent issues, a  gastroenterologist can be reached at any hour by calling [phone].   DIET:  We do recommend a small meal at first, but then you may proceed to your regular diet.  Drink plenty of fluids but you should avoid alcoholic beverages for 24 hours.  ACTIVITY:  You should plan to take it easy for the rest of today and you should NOT DRIVE or use heavy machinery until tomorrow (because of the sedation medicines used during the test).    FOLLOW UP: Our staff will call the number listed on your records the next business day following your procedure to check on you and address any questions or concerns that you may have regarding the information given to you following your procedure. If we do not reach you, we will leave a message.  However, if you are feeling well and you are not experiencing any problems, there is no need to return our call.  We will assume that you have returned to your regular daily activities without incident.  If any biopsies were taken you will be contacted by phone or by letter within the next 1-3 weeks.  Please call us at [phone] if you have not heard about the biopsies in 3 weeks.    SIGNATURES/CONFIDENTIALITY: You and/or your care partner have signed paperwork which will be entered into your electronic medical record.  These signatures attest to the fact that that the information above on your After Visit Summary has been reviewed and is understood.  Full responsibility  of the confidentiality of this discharge information lies with you and/or your care-partner.YOU HAD AN ENDOSCOPIC PROCEDURE TODAY AT [facility]:   Refer to the procedure report that was given to you for any specific questions about what was found during the examination.  If the procedure report does not answer your questions, please call your gastroenterologist to clarify.  If you requested that your care partner not be given the details of your procedure findings, then the procedure report  has been included in a sealed envelope for you to review at your convenience later.  YOU SHOULD EXPECT: Some feelings of bloating in the abdomen. Passage of more gas than usual.  Walking can help get rid of the air that was put into your GI tract during the procedure and reduce the bloating. If you had a lower endoscopy (such as a colonoscopy or flexible sigmoidoscopy) you may notice spotting of blood in your stool or on the toilet paper. If you underwent a bowel prep for your procedure, you may not have a normal bowel movement for a few days.  Please Note:  You might notice some irritation and congestion in your nose or some drainage.  This is from the oxygen used during your procedure.  There is no need for concern and it should clear up in a day or so.  SYMPTOMS TO REPORT IMMEDIATELY:   Following lower endoscopy (colonoscopy or flexible sigmoidoscopy):  Excessive amounts of blood in the stool  Significant tenderness or worsening of abdominal pains  Swelling of the abdomen that is new, acute  Fever of 100F or higher For urgent or emergent issues, a gastroenterologist can be reached at any hour by calling [phone].   DIET:  We do recommend a small meal at first, but then you may proceed to your regular diet.  Drink plenty of fluids but you should avoid alcoholic beverages for 24 hours.  ACTIVITY:  You should plan to take it easy for the rest of today and you should NOT DRIVE or use heavy machinery until tomorrow (because of the sedation medicines used during the test).    FOLLOW UP: Our staff will call the number listed on your records the next business day following your procedure to check on you and address any questions or concerns that you may have regarding the information given to you following your procedure. If we do not reach you, we will leave a message.  However, if you are feeling well and you are not experiencing any problems, there is no need to return our call.  We will  assume that you have returned to your regular daily activities without incident.  If any biopsies were taken you will be contacted by phone or by letter within the next 1-3 weeks.  Please call us at [phone] if you have not heard about the biopsies in 3 weeks.    SIGNATURES/CONFIDENTIALITY: You and/or your care partner have signed paperwork which will be entered into your electronic medical record.  These signatures attest to the fact that that the information above on your After Visit Summary has been reviewed and is understood.  Full responsibility of the confidentiality of this discharge information lies with you and/or your care-partner.

## 2016-02-27 ENCOUNTER — Telehealth: Payer: Self-pay

## 2016-02-27 ENCOUNTER — Telehealth: Payer: Self-pay | Admitting: *Deleted

## 2016-02-27 NOTE — Telephone Encounter (Signed)
  Follow up Call-  Call back number 02/26/2016  Post procedure Call Back phone  # [phone]  Permission to leave phone message Yes  Some recent data might be hidden     Patient questions:  Do you have a fever, pain , or abdominal swelling? No. Pain Score  0 *  Have you tolerated food without any problems? Yes.    Have you been able to return to your normal activities? Yes.    Do you have any questions about your discharge instructions: Diet   No. Medications  No. Follow up visit  No.  Do you have questions or concerns about your Care? No.  Actions: * If pain score is 4 or above: No action needed, pain <4.

## 2016-02-27 NOTE — Telephone Encounter (Signed)
  Follow up Call-  Call back number 02/26/2016  Post procedure Call Back phone  # [phone]  Permission to leave phone message Yes  Some recent data might be hidden    Patient was called for follow up after his procedure on 02/26/2016. No answer at the number given for follow up phone call. A message was left on the answering machine. This was the second attempt to contact the patient.

## 2016-02-27 NOTE — Telephone Encounter (Signed)
  No answer, recording stated no calls from outside.

## 2016-03-04 ENCOUNTER — Encounter: Payer: Self-pay | Admitting: Gastroenterology

## 2016-03-20 ENCOUNTER — Encounter: Payer: Self-pay | Admitting: *Deleted

## 2016-03-23 ENCOUNTER — Other Ambulatory Visit: Payer: Self-pay | Admitting: Internal Medicine

## 2016-07-22 ENCOUNTER — Ambulatory Visit (INDEPENDENT_AMBULATORY_CARE_PROVIDER_SITE_OTHER): Payer: Medicare Other | Admitting: Internal Medicine

## 2016-07-22 ENCOUNTER — Encounter: Payer: Self-pay | Admitting: Internal Medicine

## 2016-07-22 VITALS — BP 142/80 | HR 60 | Temp 97.3°F | Resp 16 | Ht 70.75 in | Wt 186.2 lb

## 2016-07-22 DIAGNOSIS — Z1212 Encounter for screening for malignant neoplasm of rectum: Secondary | ICD-10-CM

## 2016-07-22 DIAGNOSIS — Z125 Encounter for screening for malignant neoplasm of prostate: Secondary | ICD-10-CM

## 2016-07-22 DIAGNOSIS — I10 Essential (primary) hypertension: Secondary | ICD-10-CM

## 2016-07-22 DIAGNOSIS — Z79899 Other long term (current) drug therapy: Secondary | ICD-10-CM

## 2016-07-22 DIAGNOSIS — N320 Bladder-neck obstruction: Secondary | ICD-10-CM

## 2016-07-22 DIAGNOSIS — E559 Vitamin D deficiency, unspecified: Secondary | ICD-10-CM

## 2016-07-22 DIAGNOSIS — I700 Atherosclerosis of aorta: Secondary | ICD-10-CM

## 2016-07-22 DIAGNOSIS — R7309 Other abnormal glucose: Secondary | ICD-10-CM

## 2016-07-22 DIAGNOSIS — E782 Mixed hyperlipidemia: Secondary | ICD-10-CM

## 2016-07-22 DIAGNOSIS — R7303 Prediabetes: Secondary | ICD-10-CM

## 2016-07-22 DIAGNOSIS — Z136 Encounter for screening for cardiovascular disorders: Secondary | ICD-10-CM | POA: Diagnosis not present

## 2016-07-22 LAB — HEPATIC FUNCTION PANEL
ALBUMIN: 4.5 g/dL (ref 3.6–5.1)
ALT: 16 U/L (ref 9–46)
AST: 23 U/L (ref 10–35)
Alkaline Phosphatase: 50 U/L (ref 40–115)
BILIRUBIN DIRECT: 0.2 mg/dL (ref <=0.2)
BILIRUBIN TOTAL: 0.8 mg/dL (ref 0.2–1.2)
Indirect Bilirubin: 0.6 mg/dL (ref 0.2–1.2)
Total Protein: 6.8 g/dL (ref 6.1–8.1)

## 2016-07-22 LAB — HEMOGLOBIN A1C
Hgb A1c MFr Bld: 5.4 % (ref <5.7)
Mean Plasma Glucose: 108 mg/dL

## 2016-07-22 LAB — CBC WITH DIFFERENTIAL/PLATELET
BASOS ABS: 0 {cells}/uL (ref 0–200)
BASOS PCT: 0 %
EOS PCT: 1 %
Eosinophils Absolute: 68 cells/uL (ref 15–500)
HCT: 47.3 % (ref 38.5–50.0)
Hemoglobin: 16 g/dL (ref 13.2–17.1)
LYMPHS PCT: 29 %
Lymphs Abs: 1972 cells/uL (ref 850–3900)
MCH: 31.8 pg (ref 27.0–33.0)
MCHC: 33.8 g/dL (ref 32.0–36.0)
MCV: 94 fL (ref 80.0–100.0)
MONOS PCT: 11 %
MPV: 10.7 fL (ref 7.5–12.5)
Monocytes Absolute: 748 cells/uL (ref 200–950)
NEUTROS ABS: 4012 {cells}/uL (ref 1500–7800)
Neutrophils Relative %: 59 %
PLATELETS: 195 10*3/uL (ref 140–400)
RBC: 5.03 MIL/uL (ref 4.20–5.80)
RDW: 13.7 % (ref 11.0–15.0)
WBC: 6.8 10*3/uL (ref 3.8–10.8)

## 2016-07-22 LAB — LIPID PANEL
CHOL/HDL RATIO: 3.3 ratio (ref <5.0)
CHOLESTEROL: 190 mg/dL (ref <200)
HDL: 58 mg/dL (ref >40)
LDL Cholesterol: 95 mg/dL (ref <100)
TRIGLYCERIDES: 186 mg/dL — AB (ref <150)
VLDL: 37 mg/dL — ABNORMAL HIGH (ref <30)

## 2016-07-22 LAB — MAGNESIUM: MAGNESIUM: 1.9 mg/dL (ref 1.5–2.5)

## 2016-07-22 LAB — BASIC METABOLIC PANEL WITH GFR
BUN: 9 mg/dL (ref 7–25)
CO2: 26 mmol/L (ref 20–31)
CREATININE: 1.03 mg/dL (ref 0.70–1.25)
Calcium: 9.7 mg/dL (ref 8.6–10.3)
Chloride: 104 mmol/L (ref 98–110)
GFR, EST AFRICAN AMERICAN: 86 mL/min (ref >=60)
GFR, Est Non African American: 75 mL/min (ref >=60)
Glucose, Bld: 116 mg/dL — ABNORMAL HIGH (ref 65–99)
Potassium: 3.7 mmol/L (ref 3.5–5.3)
SODIUM: 141 mmol/L (ref 135–146)

## 2016-07-22 LAB — URINALYSIS, ROUTINE W REFLEX MICROSCOPIC
Bilirubin Urine: NEGATIVE
GLUCOSE, UA: NEGATIVE
Hgb urine dipstick: NEGATIVE
Ketones, ur: NEGATIVE
LEUKOCYTES UA: NEGATIVE
Nitrite: NEGATIVE
PH: 6.5 (ref 5.0–8.0)
Protein, ur: NEGATIVE
Specific Gravity, Urine: 1.003 (ref 1.001–1.035)

## 2016-07-22 LAB — PSA: PSA: 1.8 ng/mL (ref <=4.0)

## 2016-07-22 LAB — TSH: TSH: 1.42 m[IU]/L (ref 0.40–4.50)
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
## 2016-07-22 NOTE — Patient Instructions (Signed)

## 2016-07-23 LAB — MICROALBUMIN / CREATININE URINE RATIO: Creatinine, Urine: 32 mg/dL (ref 20–370)

## 2016-07-23 LAB — VITAMIN D 25 HYDROXY (VIT D DEFICIENCY, FRACTURES): Vit D, 25-Hydroxy: 39 ng/mL (ref 30–100)

## 2016-07-23 LAB — INSULIN, RANDOM: Insulin: 11.2 u[IU]/mL (ref 2.0–19.6)

## 2016-09-24 ENCOUNTER — Other Ambulatory Visit: Payer: Self-pay | Admitting: Internal Medicine

## 2016-12-31 DIAGNOSIS — Z23 Encounter for immunization: Secondary | ICD-10-CM | POA: Diagnosis not present

## 2017-01-25 NOTE — Progress Notes (Signed)
MEDICARE ANNUAL WELLNESS VISIT AND FOLLOW UP Assessment:    Essential hypertension - continue medications, DASH diet, exercise and monitor at home. Call if greater than 130/80.  -     CBC with Differential/Platelet -     BASIC METABOLIC PANEL WITH GFR -     Hepatic function panel  Cigarette nicotine dependence without complication Smoking cessation-  instruction/counseling given, counseled patient on the dangers of tobacco use, advised patient to stop smoking, and reviewed strategies to maximize success, patient not ready to quit at this time.   Alcohol abuse Counseled on decreasing, no hx of DTS  Prediabetes Discussed general issues about diabetes pathophysiology and management., Educational material distributed., Suggested low cholesterol diet., Encouraged aerobic exercise., Discussed foot care., Reminded to get yearly retinal exam.  Mixed hyperlipidemia -continue medications, check lipids, decrease fatty foods, increase activity.  -     Lipid panel  Medication management Monitor  Vitamin D deficiency Continue supplement  BMI 25.0-25.9,adult  Advanced care planning/counseling discussion Discussed with patient  Encounter for Medicare annual wellness exam 1 year  Need for prophylactic vaccination against Streptococcus pneumoniae (pneumococcus) -     Pneumococcal polysaccharide vaccine 23-valent greater than or equal to 2yo subcutaneous/IM    Over 30 minutes of exam, counseling, chart review, and critical decision making was performed Future Appointments Date Time Provider [place]  08/02/2017 10:00 AM Unk Pinto, MD [facility] None    Plan:   During the course of the visit the patient was educated and counseled about appropriate screening and preventive services including:    Pneumococcal vaccine   Influenza vaccine  Prevnar 13  Td vaccine  Screening electrocardiogram  Colorectal cancer screening  Diabetes screening  Glaucoma  screening  Nutrition counseling     Subjective:  Michael Fields is a 66 y.o. male who presents for Medicare Annual Wellness Visit and 3 month follow up for HTN, hyperlipidemia, prediabetes, and vitamin D Def.   His blood pressure has been controlled at home, today their BP is BP: 136/80 He does not workout, does yard work. He denies chest pain, shortness of breath, dizziness.    He is not on cholesterol medication and denies myalgias. His cholesterol is not at goal. The cholesterol last visit was:   Lab Results  Component Value Date   CHOL 190 07/22/2016   HDL 58 07/22/2016   LDLCALC 95 07/22/2016   TRIG 186 (H) 07/22/2016   CHOLHDL 3.3 07/22/2016   He has been working on diet and exercise for prediabetes, and denies foot ulcerations, hyperglycemia, hypoglycemia , increased appetite, nausea, paresthesia of the feet, polydipsia, polyuria, visual disturbances, vomiting and weight loss. Last A1C in the office was:  Lab Results  Component Value Date   HGBA1C 5.4 07/22/2016   Last GFR Lab Results  Component Value Date   GFRNONAA 75 07/22/2016   Patient is on Vitamin D supplement.   Lab Results  Component Value Date   VD25OH 39 07/22/2016     BMI is Body mass index is 26.41 kg/m., he is working on diet and exercise. Wt Readings from Last 3 Encounters:  01/26/17 188 lb (85.3 kg)  07/22/16 186 lb 3.2 oz (84.5 kg)  02/26/16 180 lb (81.6 kg)     Medication Review: Current Outpatient Prescriptions on File Prior to Visit  Medication Sig Dispense Refill  . aspirin EC 81 MG tablet Take 81 mg by mouth daily.    . bisoprolol-hydrochlorothiazide (ZIAC) 5-6.25 MG tablet TAKE 1 TABLET BY MOUTH DAILY.  90 tablet 1  . Cholecalciferol (VITAMIN D PO) Take 1,000 Units by mouth. Taking 2000 to 3000 units daily     Current Facility-Administered Medications on File Prior to Visit  Medication Dose Route Frequency Provider Last Rate Last Dose  . 0.9 %  sodium chloride infusion  500 mL  Intravenous Continuous Danis, Kirke Corin, MD        Current Problems (verified) Patient Active Problem List   Diagnosis Date Noted  . BMI 25.0-25.9,adult 02/05/2015  . Medication management 10/30/2014  . Mixed hyperlipidemia 06/24/2014  . HTN 05/14/2014  . Prediabetes 05/14/2014  . Vitamin D deficiency 05/14/2014  . Nicotine dependence 01/17/2014  . Alcohol abuse 01/17/2014    Screening Tests Immunization History  Administered Date(s) Administered  . Influenza-Unspecified 01/06/2016, 12/31/2016  . Pneumococcal Conjugate-13 10/01/2015  . Pneumococcal Polysaccharide-23 01/26/2017  . Tdap 01/14/2014    Preventative care: Last colonoscopy: 02/2016  Prior vaccinations: TD or Tdap: 2015  Influenza: Declined Pneumococcal: 2018 Prevnar13: 2017 Shingles/Zostavax: Declined  Names of Other Physician/Practitioners you currently use: 1. [facility] here for primary care 2. Does not see one, eye doctor, last visit NONE 3. Does not see one, dentist, last visit NONE Patient Care Team: Unk Pinto, MD as PCP - General (Internal Medicine) Charlotte Crumb, MD as Consulting Physician (Orthopedic Surgery) Altamese Halifax, MD as Consulting Physician (Orthopedic Surgery)  Allergies No Known Allergies  SURGICAL HISTORY He  has a past surgical history that includes Brain surgery; Hernia repair (2012); ORIF wrist fracture (Right, 04/02/2013); ORIF acetabular fracture (Right, 01/15/2014); and Hip surgery. FAMILY HISTORY His family history is not on file.  States does not know FM history, never went to doctors SOCIAL HISTORY He  reports that he has been smoking Cigarettes.  He has been smoking about 1.00 pack per day. He has never used smokeless tobacco. He reports that he drinks about 14.4 oz of alcohol per week . He reports that he does not use drugs. 6-10 beers, will vary.   MEDICARE WELLNESS OBJECTIVES: Physical activity: Current Exercise  Habits: The patient does not participate in regular exercise at present (does yard work) Cardiac risk factors: Cardiac Risk Factors include: advanced age (>66men, >52 women);diabetes mellitus;dyslipidemia;hypertension;male gender;smoking/ tobacco exposure;sedentary lifestyle Depression/mood screen:   Depression screen [facility] 2/9 01/26/2017  Decreased Interest 0  Down, Depressed, Hopeless 0  PHQ - 2 Score 0    ADLs:  In your present state of health, do you have any difficulty performing the following activities: 07/22/2016  Hearing? N  Vision? N  Difficulty concentrating or making decisions? N  Walking or climbing stairs? N  Dressing or bathing? N  Doing errands, shopping? N  Some recent data might be hidden     Cognitive Testing  Alert? Yes  Normal Appearance?Yes  Oriented to person? Yes  Place? Yes   Time? Yes  Recall of three objects?  Yes  Can perform simple calculations? Yes  Displays appropriate judgment?Yes  Can read the correct time from a watch face?Yes  EOL planning: Does Patient Have a Medical Advance Directive?: No Would patient like information on creating a medical advance directive?: Yes (MAU/Ambulatory/Procedural Areas - Information given)   Objective:   Today's Vitals   01/26/17 0930  BP: 136/80  Pulse: 73  Resp: 16  Temp: (!) 97.5 F (36.4 C)  SpO2: 98%  Weight: 188 lb (85.3 kg)  Height: 5' 10.75" (1.797 m)  PainSc: 0-No pain   Body mass index is 26.41 kg/m.  General appearance: alert, no distress, WD/WN, male HEENT: normocephalic, sclerae anicteric, TMs pearly, nares patent, no discharge or erythema, pharynx normal Oral cavity: no lesions, upper dentures, poor dentition Neck: supple, no lymphadenopathy, no thyromegaly, no masses Heart: RRR, normal S1, S2, no murmurs Lungs: CTA bilaterally, no wheezes, rhonchi, or rales Abdomen: +bs, soft, non tender, non distended, no masses, no hepatomegaly, no splenomegaly Musculoskeletal: nontender, no swelling,  no obvious deformity Extremities: no edema, no cyanosis, no clubbing Pulses: 2+ symmetric, upper and lower extremities, normal cap refill Neurological: alert, oriented x 3, CN2-12 intact, strength normal upper extremities and lower extremities, sensation normal throughout, DTRs 2+ throughout, no cerebellar signs, gait normal Psychiatric: normal affect, behavior normal, pleasant   Medicare Attestation I have personally reviewed: The patient's medical and social history Their use of alcohol, tobacco or illicit drugs Their current medications and supplements The patient's functional ability including ADLs,fall risks, home safety risks, cognitive, and hearing and visual impairment Diet and physical activities Evidence for depression or mood disorders  The patient's weight, height, BMI, and visual acuity have been recorded in the chart.  I have made referrals, counseling, and provided education to the patient based on review of the above and I have provided the patient with a written personalized care plan for preventive services.     Vicie Mutters, PA-C   01/26/2017

## 2017-01-26 ENCOUNTER — Encounter: Payer: Self-pay | Admitting: Physician Assistant

## 2017-01-26 ENCOUNTER — Ambulatory Visit (INDEPENDENT_AMBULATORY_CARE_PROVIDER_SITE_OTHER): Payer: Medicare Other | Admitting: Physician Assistant

## 2017-01-26 ENCOUNTER — Ambulatory Visit: Payer: Self-pay | Admitting: Internal Medicine

## 2017-01-26 VITALS — BP 136/80 | HR 73 | Temp 97.5°F | Resp 16 | Ht 70.75 in | Wt 188.0 lb

## 2017-01-26 DIAGNOSIS — F1010 Alcohol abuse, uncomplicated: Secondary | ICD-10-CM

## 2017-01-26 DIAGNOSIS — Z79899 Other long term (current) drug therapy: Secondary | ICD-10-CM | POA: Diagnosis not present

## 2017-01-26 DIAGNOSIS — Z7189 Other specified counseling: Secondary | ICD-10-CM

## 2017-01-26 DIAGNOSIS — E782 Mixed hyperlipidemia: Secondary | ICD-10-CM | POA: Diagnosis not present

## 2017-01-26 DIAGNOSIS — Z0000 Encounter for general adult medical examination without abnormal findings: Secondary | ICD-10-CM

## 2017-01-26 DIAGNOSIS — F17210 Nicotine dependence, cigarettes, uncomplicated: Secondary | ICD-10-CM

## 2017-01-26 DIAGNOSIS — Z6825 Body mass index (BMI) 25.0-25.9, adult: Secondary | ICD-10-CM

## 2017-01-26 DIAGNOSIS — Z23 Encounter for immunization: Secondary | ICD-10-CM | POA: Diagnosis not present

## 2017-01-26 DIAGNOSIS — R7303 Prediabetes: Secondary | ICD-10-CM | POA: Diagnosis not present

## 2017-01-26 DIAGNOSIS — I10 Essential (primary) hypertension: Secondary | ICD-10-CM

## 2017-01-26 DIAGNOSIS — Z0001 Encounter for general adult medical examination with abnormal findings: Secondary | ICD-10-CM | POA: Diagnosis not present

## 2017-01-26 DIAGNOSIS — R6889 Other general symptoms and signs: Secondary | ICD-10-CM

## 2017-01-26 DIAGNOSIS — E559 Vitamin D deficiency, unspecified: Secondary | ICD-10-CM

## 2017-01-26 LAB — CBC WITH DIFFERENTIAL/PLATELET
Basophils Absolute: 16 cells/uL (ref 0–200)
Basophils Relative: 0.2 %
EOS PCT: 0.9 %
Eosinophils Absolute: 73 cells/uL (ref 15–500)
HEMATOCRIT: 48.5 % (ref 38.5–50.0)
HEMOGLOBIN: 16.4 g/dL (ref 13.2–17.1)
LYMPHS ABS: 1701 {cells}/uL (ref 850–3900)
MCH: 31.5 pg (ref 27.0–33.0)
MCHC: 33.8 g/dL (ref 32.0–36.0)
MCV: 93.1 fL (ref 80.0–100.0)
MPV: 12.5 fL (ref 7.5–12.5)
Monocytes Relative: 8.9 %
Neutro Abs: 5589 cells/uL (ref 1500–7800)
Neutrophils Relative %: 69 %
Platelets: 167 10*3/uL (ref 140–400)
RBC: 5.21 10*6/uL (ref 4.20–5.80)
RDW: 12.7 % (ref 11.0–15.0)
Total Lymphocyte: 21 %
WBC mixed population: 721 cells/uL (ref 200–950)
WBC: 8.1 10*3/uL (ref 3.8–10.8)

## 2017-01-26 LAB — LIPID PANEL
Cholesterol: 214 mg/dL — ABNORMAL HIGH (ref <200)
HDL: 59 mg/dL (ref >40)
LDL CHOLESTEROL (CALC): 123 mg/dL — AB
Non-HDL Cholesterol (Calc): 155 mg/dL (calc) — ABNORMAL HIGH (ref <130)
TRIGLYCERIDES: 202 mg/dL — AB (ref <150)
Total CHOL/HDL Ratio: 3.6 (calc) (ref <5.0)

## 2017-01-26 LAB — BASIC METABOLIC PANEL WITH GFR
BUN: 12 mg/dL (ref 7–25)
CALCIUM: 9.8 mg/dL (ref 8.6–10.3)
CO2: 28 mmol/L (ref 20–32)
CREATININE: 1 mg/dL (ref 0.70–1.25)
Chloride: 99 mmol/L (ref 98–110)
GFR, EST NON AFRICAN AMERICAN: 78 mL/min/{1.73_m2} (ref >=60)
GFR, Est African American: 90 mL/min/{1.73_m2} (ref >=60)
Glucose, Bld: 146 mg/dL — ABNORMAL HIGH (ref 65–99)
Potassium: 4.3 mmol/L (ref 3.5–5.3)
Sodium: 138 mmol/L (ref 135–146)

## 2017-01-26 LAB — HEPATIC FUNCTION PANEL
AG RATIO: 2.2 (calc) (ref 1.0–2.5)
ALT: 16 U/L (ref 9–46)
AST: 24 U/L (ref 10–35)
Albumin: 4.8 g/dL (ref 3.6–5.1)
Alkaline phosphatase (APISO): 57 U/L (ref 40–115)
BILIRUBIN TOTAL: 0.8 mg/dL (ref 0.2–1.2)
Bilirubin, Direct: 0.2 mg/dL (ref 0.0–0.2)
Globulin: 2.2 g/dL (calc) (ref 1.9–3.7)
Indirect Bilirubin: 0.6 mg/dL (calc) (ref 0.2–1.2)
Total Protein: 7 g/dL (ref 6.1–8.1)

## 2017-02-03 NOTE — Progress Notes (Signed)
LVM for pt to return office call for LAB results.

## 2017-02-10 NOTE — Progress Notes (Signed)
LVM for pt to return office call for LAB results. Results were mailed out to pt along with a lab letter to explain the lab results.

## 2017-03-27 ENCOUNTER — Other Ambulatory Visit: Payer: Self-pay | Admitting: Internal Medicine

## 2017-08-02 ENCOUNTER — Ambulatory Visit (INDEPENDENT_AMBULATORY_CARE_PROVIDER_SITE_OTHER): Payer: Medicare Other | Admitting: Internal Medicine

## 2017-08-02 ENCOUNTER — Encounter: Payer: Self-pay | Admitting: Internal Medicine

## 2017-08-02 VITALS — BP 144/86 | HR 60 | Temp 97.0°F | Resp 18 | Ht 70.5 in | Wt 193.6 lb

## 2017-08-02 DIAGNOSIS — R7303 Prediabetes: Secondary | ICD-10-CM

## 2017-08-02 DIAGNOSIS — E559 Vitamin D deficiency, unspecified: Secondary | ICD-10-CM | POA: Diagnosis not present

## 2017-08-02 DIAGNOSIS — Z125 Encounter for screening for malignant neoplasm of prostate: Secondary | ICD-10-CM

## 2017-08-02 DIAGNOSIS — I10 Essential (primary) hypertension: Secondary | ICD-10-CM

## 2017-08-02 DIAGNOSIS — Z79899 Other long term (current) drug therapy: Secondary | ICD-10-CM

## 2017-08-02 DIAGNOSIS — Z1211 Encounter for screening for malignant neoplasm of colon: Secondary | ICD-10-CM

## 2017-08-02 DIAGNOSIS — Z136 Encounter for screening for cardiovascular disorders: Secondary | ICD-10-CM | POA: Diagnosis not present

## 2017-08-02 DIAGNOSIS — I700 Atherosclerosis of aorta: Secondary | ICD-10-CM

## 2017-08-02 DIAGNOSIS — F17200 Nicotine dependence, unspecified, uncomplicated: Secondary | ICD-10-CM

## 2017-08-02 DIAGNOSIS — E782 Mixed hyperlipidemia: Secondary | ICD-10-CM | POA: Diagnosis not present

## 2017-08-02 DIAGNOSIS — Z1212 Encounter for screening for malignant neoplasm of rectum: Secondary | ICD-10-CM

## 2017-08-02 DIAGNOSIS — N401 Enlarged prostate with lower urinary tract symptoms: Secondary | ICD-10-CM

## 2017-08-02 DIAGNOSIS — R7309 Other abnormal glucose: Secondary | ICD-10-CM | POA: Diagnosis not present

## 2017-08-02 NOTE — Patient Instructions (Signed)
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
## 2017-08-03 ENCOUNTER — Other Ambulatory Visit: Payer: Self-pay | Admitting: Internal Medicine

## 2017-08-03 LAB — BASIC METABOLIC PANEL WITH GFR
BUN: 19 mg/dL (ref 7–25)
CALCIUM: 10 mg/dL (ref 8.6–10.3)
CHLORIDE: 99 mmol/L (ref 98–110)
CO2: 28 mmol/L (ref 20–32)
Creat: 1.11 mg/dL (ref 0.70–1.25)
GFR, Est African American: 79 mL/min/{1.73_m2} (ref >=60)
GFR, Est Non African American: 68 mL/min/{1.73_m2} (ref >=60)
GLUCOSE: 131 mg/dL — AB (ref 65–99)
POTASSIUM: 4 mmol/L (ref 3.5–5.3)
SODIUM: 137 mmol/L (ref 135–146)

## 2017-08-03 LAB — TSH: TSH: 1.43 mIU/L (ref 0.40–4.50)

## 2017-08-03 LAB — CBC WITH DIFFERENTIAL/PLATELET
BASOS ABS: 33 {cells}/uL (ref 0–200)
Basophils Relative: 0.5 %
EOS PCT: 1.5 %
Eosinophils Absolute: 98 cells/uL (ref 15–500)
HCT: 46.1 % (ref 38.5–50.0)
Hemoglobin: 15.9 g/dL (ref 13.2–17.1)
LYMPHS ABS: 1989 {cells}/uL (ref 850–3900)
MCH: 31.7 pg (ref 27.0–33.0)
MCHC: 34.5 g/dL (ref 32.0–36.0)
MCV: 91.8 fL (ref 80.0–100.0)
MONOS PCT: 8.9 %
MPV: 11.7 fL (ref 7.5–12.5)
Neutro Abs: 3803 cells/uL (ref 1500–7800)
Neutrophils Relative %: 58.5 %
Platelets: 199 10*3/uL (ref 140–400)
RBC: 5.02 10*6/uL (ref 4.20–5.80)
RDW: 13.1 % (ref 11.0–15.0)
Total Lymphocyte: 30.6 %
WBC mixed population: 579 cells/uL (ref 200–950)
WBC: 6.5 10*3/uL (ref 3.8–10.8)

## 2017-08-03 LAB — HEPATIC FUNCTION PANEL
AG Ratio: 2 (calc) (ref 1.0–2.5)
ALKALINE PHOSPHATASE (APISO): 55 U/L (ref 40–115)
ALT: 16 U/L (ref 9–46)
AST: 21 U/L (ref 10–35)
Albumin: 4.7 g/dL (ref 3.6–5.1)
Bilirubin, Direct: 0.2 mg/dL (ref 0.0–0.2)
Globulin: 2.3 g/dL (calc) (ref 1.9–3.7)
Indirect Bilirubin: 0.6 mg/dL (calc) (ref 0.2–1.2)
TOTAL PROTEIN: 7 g/dL (ref 6.1–8.1)
Total Bilirubin: 0.8 mg/dL (ref 0.2–1.2)

## 2017-08-03 LAB — URINALYSIS, ROUTINE W REFLEX MICROSCOPIC
BILIRUBIN URINE: NEGATIVE
Glucose, UA: NEGATIVE
Hgb urine dipstick: NEGATIVE
Ketones, ur: NEGATIVE
LEUKOCYTES UA: NEGATIVE
Nitrite: NEGATIVE
PROTEIN: NEGATIVE
SPECIFIC GRAVITY, URINE: 1.004 (ref 1.001–1.03)
pH: 6 (ref 5.0–8.0)

## 2017-08-03 LAB — MICROALBUMIN / CREATININE URINE RATIO
Creatinine, Urine: 23 mg/dL (ref 20–320)
Microalb, Ur: <0.2 mg/dL

## 2017-08-03 LAB — INSULIN, RANDOM: INSULIN: 11.5 u[IU]/mL (ref 2.0–19.6)

## 2017-08-03 LAB — LIPID PANEL
CHOLESTEROL: 208 mg/dL — AB (ref <200)
HDL: 54 mg/dL (ref >40)
LDL Cholesterol (Calc): 124 mg/dL (calc) — ABNORMAL HIGH
NON-HDL CHOLESTEROL (CALC): 154 mg/dL — AB (ref <130)
Total CHOL/HDL Ratio: 3.9 (calc) (ref <5.0)
Triglycerides: 178 mg/dL — ABNORMAL HIGH (ref <150)

## 2017-08-03 LAB — HEMOGLOBIN A1C
EAG (MMOL/L): 6.2 (calc)
Hgb A1c MFr Bld: 5.5 % of total Hgb (ref <5.7)
MEAN PLASMA GLUCOSE: 111 (calc)

## 2017-08-03 LAB — VITAMIN D 25 HYDROXY (VIT D DEFICIENCY, FRACTURES): Vit D, 25-Hydroxy: 38 ng/mL (ref 30–100)

## 2017-08-03 LAB — PSA: PSA: 2.4 ng/mL (ref <=4.0)

## 2017-08-03 LAB — MAGNESIUM: MAGNESIUM: 1.9 mg/dL (ref 1.5–2.5)

## 2017-08-03 MED ORDER — ROSUVASTATIN CALCIUM 40 MG PO TABS: ORAL_TABLET | ORAL | 5 refills | Status: DC

## 2017-09-22 ENCOUNTER — Other Ambulatory Visit: Payer: Self-pay | Admitting: Internal Medicine

## 2017-11-28 NOTE — Progress Notes (Signed)
FOLLOW UP  Assessment and Plan:   Hypertension Poorly controlled; discussed increasing dose of ziac, patient very resistant, will bring him back in 1 week for NV for recheck, plan on increasing ziac to 10-6.25  Monitor blood pressure at home; patient to call if consistently greater than 130/80 Continue DASH diet.   Reminder to go to the ER if any CP, SOB, nausea, dizziness, severe HA, changes vision/speech, left arm numbness and tingling and jaw pain.  Cholesterol Currently above goal; currently taking crestor 40 mg alternating with 20 mg, discussed increase to 40 mg daily  Continue low cholesterol diet and exercise.  Check lipid panel.   Other abnormal gluocse Recent A1Cs at goal Discussed diet/exercise, weight management  Defer A1C; check BMP  Overweight Long discussion about weight loss, diet, and exercise Recommended diet heavy in fruits and veggies and low in animal meats, cheeses, and dairy products, appropriate calorie intake Discussed ideal weight for height  Will follow up in 3 months  Vitamin D Def Below goal at last visit; he has not increased dose continue supplementation for goal of 70-100 Defer Vit D level  Tobacco use Discussed risks associated with tobacco use and advised to reduce or quit Discussed chantix, wellbutrin, patient declines at this time Advised to consider strongly Will follow up at the next visit  Continue diet and meds as discussed. Further disposition pending results of labs. Discussed med's effects and SE's.   Over 30 minutes of exam, counseling, chart review, and critical decision making was performed.   Future Appointments  Date Time Provider [place]  03/07/2018 10:30 AM Unk Pinto, MD [facility] None  08/31/2018 10:00 AM Unk Pinto, MD [facility] None    ----------------------------------------------------------------------------------------------------------------------  HPI 67 y.o. male  presents for 3 month  follow up on hypertension, cholesterol, glucose management, weight and vitamin D deficiency.   he currently continues to smoke 0.75-1 pack a day; discussed risks associated with smoking, patient is not ready to quit.   BMI is Body mass index is 27.12 kg/m., he has been working on diet and exercise, he currently walks 3 days a week.  Wt Readings from Last 3 Encounters:  11/29/17 189 lb (85.7 kg)  08/02/17 193 lb 9.6 oz (87.8 kg)  01/26/17 188 lb (85.3 kg)   He does not check BP at home, today their BP is BP: (!) 166/70 Similar by manual recheck by provider. He currently takes ziac 5-6.25 dailly.   He does workout. He denies chest pain, shortness of breath, dizziness.   He is on cholesterol medication (crestor alternating 20 mg and 40 mg daily) and denies myalgias. His cholesterol is not at goal. The cholesterol last visit was:   Lab Results  Component Value Date   CHOL 208 (H) 08/02/2017   HDL 54 08/02/2017   LDLCALC 124 (H) 08/02/2017   TRIG 178 (H) 08/02/2017   CHOLHDL 3.9 08/02/2017    He has been working on diet and exercise for glucose management, and denies foot ulcerations, increased appetite, nausea, paresthesia of the feet, polydipsia, polyuria, visual disturbances, vomiting and weight loss. Last A1C in the office was:  Lab Results  Component Value Date   HGBA1C 5.5 08/02/2017   Patient is on Vitamin D supplement.   Lab Results  Component Value Date   VD25OH 38 08/02/2017        Current Medications:  Current Outpatient Medications on File Prior to Visit  Medication Sig  . aspirin EC 81 MG tablet Take 81 mg by  mouth daily.  . bisoprolol-hydrochlorothiazide (ZIAC) 5-6.25 MG tablet TAKE 1 TABLET BY MOUTH DAILY.  Marland Kitchen Cholecalciferol (VITAMIN D PO) Take 5,000 Units by mouth 2 (two) times daily.   . rosuvastatin (CRESTOR) 40 MG tablet Take 1/2 to 1 tablet daily or as directed for Cholesterol   No current facility-administered medications on file prior to visit.       Allergies: No Known Allergies   Medical History:  Past Medical History:  Diagnosis Date  . Hemorrhoids   . Hypertension   . Nicotine dependence    Family history- Reviewed and unchanged Social history- Reviewed and unchanged   Review of Systems:  Review of Systems  Constitutional: Negative for malaise/fatigue and weight loss.  HENT: Negative for hearing loss and tinnitus.   Eyes: Negative for blurred vision and double vision.  Respiratory: Negative for cough, shortness of breath and wheezing.   Cardiovascular: Negative for chest pain, palpitations, orthopnea, claudication and leg swelling.  Gastrointestinal: Negative for abdominal pain, blood in stool, constipation, diarrhea, heartburn, melena, nausea and vomiting.  Genitourinary: Negative.   Musculoskeletal: Negative for joint pain and myalgias.  Skin: Negative for rash.  Neurological: Negative for dizziness, tingling, sensory change, weakness and headaches.  Endo/Heme/Allergies: Negative for polydipsia.  Psychiatric/Behavioral: Positive for substance abuse (Excessive ETOH).  All other systems reviewed and are negative.   Physical Exam: BP (!) 166/70   Pulse (!) 103   Temp 97.7 F (36.5 C)   Ht 5\' 10"  (1.778 m)   Wt 189 lb (85.7 kg)   SpO2 98%   BMI 27.12 kg/m  Wt Readings from Last 3 Encounters:  11/29/17 189 lb (85.7 kg)  08/02/17 193 lb 9.6 oz (87.8 kg)  01/26/17 188 lb (85.3 kg)   General Appearance: Well nourished, in no apparent distress. Eyes: PERRLA, EOMs, conjunctiva no swelling or erythema Sinuses: No Frontal/maxillary tenderness ENT/Mouth: Ext aud canals clear, TMs without erythema, bulging. No erythema, swelling, or exudate on post pharynx.  Tonsils not swollen or erythematous. Hearing normal.  Neck: Supple, thyroid normal.  Respiratory: Respiratory effort normal, BS equal bilaterally without rales, rhonchi, wheezing or stridor.  Cardio: RRR with no MRGs. Brisk peripheral pulses without edema.   Abdomen: Soft, + BS.  Non tender, no guarding, rebound, hernias, masses. Lymphatics: Non tender without lymphadenopathy.  Musculoskeletal: Full ROM, 5/5 strength, Normal gait Skin: Warm, dry without rashes, lesions, ecchymosis.  Neuro: Cranial nerves intact. No cerebellar symptoms.  Psych: Awake and oriented X 3, normal affect, Insight and Judgment appropriate.    Izora Ribas, NP 10:56 AM [facility]

## 2017-11-29 ENCOUNTER — Ambulatory Visit (INDEPENDENT_AMBULATORY_CARE_PROVIDER_SITE_OTHER): Payer: Medicare Other | Admitting: Adult Health

## 2017-11-29 ENCOUNTER — Encounter: Payer: Self-pay | Admitting: Adult Health

## 2017-11-29 VITALS — BP 166/70 | HR 103 | Temp 97.7°F | Ht 70.0 in | Wt 189.0 lb

## 2017-11-29 DIAGNOSIS — F17210 Nicotine dependence, cigarettes, uncomplicated: Secondary | ICD-10-CM

## 2017-11-29 DIAGNOSIS — R7309 Other abnormal glucose: Secondary | ICD-10-CM

## 2017-11-29 DIAGNOSIS — E663 Overweight: Secondary | ICD-10-CM

## 2017-11-29 DIAGNOSIS — I10 Essential (primary) hypertension: Secondary | ICD-10-CM

## 2017-11-29 DIAGNOSIS — E782 Mixed hyperlipidemia: Secondary | ICD-10-CM

## 2017-11-29 DIAGNOSIS — Z79899 Other long term (current) drug therapy: Secondary | ICD-10-CM

## 2017-11-29 DIAGNOSIS — E559 Vitamin D deficiency, unspecified: Secondary | ICD-10-CM | POA: Diagnosis not present

## 2017-11-29 LAB — CBC WITH DIFFERENTIAL/PLATELET
BASOS ABS: 30 {cells}/uL (ref 0–200)
Basophils Relative: 0.4 %
EOS ABS: 38 {cells}/uL (ref 15–500)
EOS PCT: 0.5 %
HCT: 48 % (ref 38.5–50.0)
Hemoglobin: 16.3 g/dL (ref 13.2–17.1)
Lymphs Abs: 1725 cells/uL (ref 850–3900)
MCH: 31.7 pg (ref 27.0–33.0)
MCHC: 34 g/dL (ref 32.0–36.0)
MCV: 93.2 fL (ref 80.0–100.0)
MONOS PCT: 7.4 %
MPV: 12.1 fL (ref 7.5–12.5)
NEUTROS ABS: 5153 {cells}/uL (ref 1500–7800)
NEUTROS PCT: 68.7 %
PLATELETS: 200 10*3/uL (ref 140–400)
RBC: 5.15 10*6/uL (ref 4.20–5.80)
RDW: 12.5 % (ref 11.0–15.0)
TOTAL LYMPHOCYTE: 23 %
WBC mixed population: 555 cells/uL (ref 200–950)
WBC: 7.5 10*3/uL (ref 3.8–10.8)

## 2017-11-29 LAB — LIPID PANEL
CHOL/HDL RATIO: 3.8 (calc) (ref <5.0)
Cholesterol: 221 mg/dL — ABNORMAL HIGH (ref <200)
HDL: 58 mg/dL (ref >40)
LDL CHOLESTEROL (CALC): 127 mg/dL — AB
Non-HDL Cholesterol (Calc): 163 mg/dL (calc) — ABNORMAL HIGH (ref <130)
Triglycerides: 219 mg/dL — ABNORMAL HIGH (ref <150)

## 2017-11-29 LAB — COMPLETE METABOLIC PANEL WITH GFR
AG Ratio: 2 (calc) (ref 1.0–2.5)
ALBUMIN MSPROF: 4.8 g/dL (ref 3.6–5.1)
ALKALINE PHOSPHATASE (APISO): 62 U/L (ref 40–115)
ALT: 16 U/L (ref 9–46)
AST: 20 U/L (ref 10–35)
BILIRUBIN TOTAL: 0.7 mg/dL (ref 0.2–1.2)
BUN: 14 mg/dL (ref 7–25)
CHLORIDE: 102 mmol/L (ref 98–110)
CO2: 25 mmol/L (ref 20–32)
CREATININE: 0.9 mg/dL (ref 0.70–1.25)
Calcium: 10.2 mg/dL (ref 8.6–10.3)
GFR, Est African American: 101 mL/min/{1.73_m2} (ref >=60)
GFR, Est Non African American: 87 mL/min/{1.73_m2} (ref >=60)
GLUCOSE: 105 mg/dL — AB (ref 65–99)
Globulin: 2.4 g/dL (calc) (ref 1.9–3.7)
Potassium: 4.5 mmol/L (ref 3.5–5.3)
Sodium: 138 mmol/L (ref 135–146)
Total Protein: 7.2 g/dL (ref 6.1–8.1)

## 2017-11-29 LAB — TSH: TSH: 1.64 mIU/L (ref 0.40–4.50)

## 2017-11-29 NOTE — Patient Instructions (Addendum)
If you cholesterol is still elevated today, I'd like for you to increase your medication to 40 mg daily  If blood pressure still above 130/80 at recheck, we will increase your blood pressure medication    Monitor your blood pressure at home, please keep a record and bring that in with you to your next office visit.   Go to the ER if any CP, SOB, nausea, dizziness, severe HA, changes vision/speech  Your most recent BP: BP: (!) 166/70   Take your medications faithfully as instructed. Maintain a healthy weight. Get at least 150 minutes of aerobic exercise per week. Minimize salt intake. Minimize alcohol intake  DASH Eating Plan DASH stands for "Dietary Approaches to Stop Hypertension." The DASH eating plan is a healthy eating plan that has been shown to reduce high blood pressure (hypertension). Additional health benefits may include reducing the risk of type 2 diabetes mellitus, heart disease, and stroke. The DASH eating plan may also help with weight loss. WHAT DO I NEED TO KNOW ABOUT THE DASH EATING PLAN? For the DASH eating plan, you will follow these general guidelines:  Choose foods with a percent daily value for sodium of less than 5% (as listed on the food label).  Use salt-free seasonings or herbs instead of table salt or sea salt.  Check with your health care provider or pharmacist before using salt substitutes.  Eat lower-sodium products, often labeled as "lower sodium" or "no salt added."  Eat fresh foods.  Eat more vegetables, fruits, and low-fat dairy products.  Choose whole grains. Look for the word "whole" as the first word in the ingredient list.  Choose fish and skinless chicken or [place] more often than red meat. Limit fish, poultry, and meat to 6 oz (170 g) each day.  Limit sweets, desserts, sugars, and sugary drinks.  Choose heart-healthy fats.  Limit cheese to 1 oz (28 g) per day.  Eat more home-cooked food and less restaurant, buffet, and fast  food.  Limit fried foods.  Cook foods using methods other than frying.  Limit canned vegetables. If you do use them, rinse them well to decrease the sodium.  When eating at a restaurant, ask that your food be prepared with less salt, or no salt if possible. WHAT FOODS CAN I EAT? Seek help from a dietitian for individual calorie needs. Grains Whole grain or whole wheat bread. Brown rice. Whole grain or whole wheat pasta. Quinoa, bulgur, and whole grain cereals. Low-sodium cereals. Corn or whole wheat flour tortillas. Whole grain cornbread. Whole grain crackers. Low-sodium crackers. Vegetables Fresh or frozen vegetables (raw, steamed, roasted, or grilled). Low-sodium or reduced-sodium tomato and vegetable juices. Low-sodium or reduced-sodium tomato sauce and paste. Low-sodium or reduced-sodium canned vegetables.  Fruits All fresh, canned (in natural juice), or frozen fruits. Meat and Other Protein Products Ground beef (85% or leaner), grass-fed beef, or beef trimmed of fat. Skinless chicken or [place]. Ground chicken or [place]. Pork trimmed of fat. All fish and seafood. Eggs. Dried beans, peas, or lentils. Unsalted nuts and seeds. Unsalted canned beans. Dairy Low-fat dairy products, such as skim or 1% milk, 2% or reduced-fat cheeses, low-fat ricotta or cottage cheese, or plain low-fat yogurt. Low-sodium or reduced-sodium cheeses. Fats and Oils Tub margarines without trans fats. Light or reduced-fat mayonnaise and salad dressings (reduced sodium). Avocado. Safflower, olive, or canola oils. Natural peanut or almond butter. Other Unsalted popcorn and pretzels. The items listed above may not be a complete list of recommended foods or beverages.  Contact your dietitian for more options. WHAT FOODS ARE NOT RECOMMENDED? Grains White bread. White pasta. White rice. Refined cornbread. Bagels and croissants. Crackers that contain trans fat. Vegetables Creamed or fried vegetables. Vegetables in a  cheese sauce. Regular canned vegetables. Regular canned tomato sauce and paste. Regular tomato and vegetable juices. Fruits Dried fruits. Canned fruit in light or heavy syrup. Fruit juice. Meat and Other Protein Products Fatty cuts of meat. Ribs, chicken wings, bacon, sausage, bologna, salami, chitterlings, fatback, hot dogs, bratwurst, and packaged luncheon meats. Salted nuts and seeds. Canned beans with salt. Dairy Whole or 2% milk, cream, half-and-half, and cream cheese. Whole-fat or sweetened yogurt. Full-fat cheeses or blue cheese. Nondairy creamers and whipped toppings. Processed cheese, cheese spreads, or cheese curds. Condiments Onion and garlic salt, seasoned salt, table salt, and sea salt. Canned and packaged gravies. Worcestershire sauce. Tartar sauce. Barbecue sauce. Teriyaki sauce. Soy sauce, including reduced sodium. Steak sauce. Fish sauce. Oyster sauce. Cocktail sauce. Horseradish. Ketchup and mustard. Meat flavorings and tenderizers. Bouillon cubes. Hot sauce. Tabasco sauce. Marinades. Taco seasonings. Relishes. Fats and Oils Butter, stick margarine, lard, shortening, ghee, and bacon fat. Coconut, palm kernel, or palm oils. Regular salad dressings. Other Pickles and olives. Salted popcorn and pretzels. The items listed above may not be a complete list of foods and beverages to avoid. Contact your dietitian for more information. WHERE CAN I FIND MORE INFORMATION? National Heart, Lung, and Blood Institute: travelstabloid.com Document Released: 04/29/2011 Document Revised: 09/24/2013 Document Reviewed: 03/14/2013 [facility] Patient Information 2015 [place], [place]. This information is not intended to replace advice given to you by your health care provider. Make sure you discuss any questions you have with your health care provider.     Steps to Quit Smoking Smoking tobacco can be harmful to your health and can affect almost every organ in your  body. Smoking puts you, and those around you, at risk for developing many serious chronic diseases. Quitting smoking is difficult, but it is one of the best things that you can do for your health. It is never too late to quit. What are the benefits of quitting smoking? When you quit smoking, you lower your risk of developing serious diseases and conditions, such as:  Lung cancer or lung disease, such as COPD.  Heart disease.  Stroke.  Heart attack.  Infertility.  Osteoporosis and bone fractures.  Additionally, symptoms such as coughing, wheezing, and shortness of breath may get better when you quit. You may also find that you get sick less often because your body is stronger at fighting off colds and infections. If you are pregnant, quitting smoking can help to reduce your chances of having a baby of low birth weight. How do I get ready to quit? When you decide to quit smoking, create a plan to make sure that you are successful. Before you quit:  Pick a date to quit. Set a date within the next two weeks to give you time to prepare.  Write down the reasons why you are quitting. Keep this list in places where you will see it often, such as on your bathroom mirror or in your car or wallet.  Identify the people, places, things, and activities that make you want to smoke (triggers) and avoid them. Make sure to take these actions: ? Throw away all cigarettes at home, at work, and in your car. ? Throw away smoking accessories, such as Scientist, research (medical). ? Clean your car and make sure to empty the ashtray. ?  Clean your home, including curtains and carpets.  Tell your family, friends, and coworkers that you are quitting. Support from your loved ones can make quitting easier.  Talk with your health care provider about your options for quitting smoking.  Find out what treatment options are covered by your health insurance.  What strategies can I use to quit smoking? Talk with your  healthcare provider about different strategies to quit smoking. Some strategies include:  Quitting smoking altogether instead of gradually lessening how much you smoke over a period of time. Research shows that quitting "cold [place]" is more successful than gradually quitting.  Attending in-person counseling to help you build problem-solving skills. You are more likely to have success in quitting if you attend several counseling sessions. Even short sessions of 10 minutes can be effective.  Finding resources and support systems that can help you to quit smoking and remain smoke-free after you quit. These resources are most helpful when you use them often. They can include: ? Online chats with a Social worker. ? Telephone quitlines. ? Careers information officer. ? Support groups or group counseling. ? Text messaging programs. ? Mobile phone applications.  Taking medicines to help you quit smoking. (If you are pregnant or breastfeeding, talk with your health care provider first.) Some medicines contain nicotine and some do not. Both types of medicines help with cravings, but the medicines that include nicotine help to relieve withdrawal symptoms. Your health care provider may recommend: ? Nicotine patches, gum, or lozenges. ? Nicotine inhalers or sprays. ? Non-nicotine medicine that is taken by mouth.  Talk with your health care provider about combining strategies, such as taking medicines while you are also receiving in-person counseling. Using these two strategies together makes you more likely to succeed in quitting than if you used either strategy on its own. If you are pregnant or breastfeeding, talk with your health care provider about finding counseling or other support strategies to quit smoking. Do not take medicine to help you quit smoking unless told to do so by your health care provider. What things can I do to make it easier to quit? Quitting smoking might feel overwhelming at first,  but there is a lot that you can do to make it easier. Take these important actions:  Reach out to your family and friends and ask that they support and encourage you during this time. Call telephone quitlines, reach out to support groups, or work with a counselor for support.  Ask people who smoke to avoid smoking around you.  Avoid places that trigger you to smoke, such as bars, parties, or smoke-break areas at work.  Spend time around people who do not smoke.  Lessen stress in your life, because stress can be a smoking trigger for some people. To lessen stress, try: ? Exercising regularly. ? Deep-breathing exercises. ? Yoga. ? Meditating. ? Performing a body scan. This involves closing your eyes, scanning your body from head to toe, and noticing which parts of your body are particularly tense. Purposefully relax the muscles in those areas.  Download or purchase mobile phone or tablet apps (applications) that can help you stick to your quit plan by providing reminders, tips, and encouragement. There are many free apps, such as QuitGuide from the [employer] Office manager for Disease Control and Prevention). You can find other support for quitting smoking (smoking cessation) through smokefree.gov and other websites.  How will I feel when I quit smoking? Within the first 24 hours of quitting smoking, you may  start to feel some withdrawal symptoms. These symptoms are usually most noticeable 2-3 days after quitting, but they usually do not last beyond 2-3 weeks. Changes or symptoms that you might experience include:  Mood swings.  Restlessness, anxiety, or irritation.  Difficulty concentrating.  Dizziness.  Strong cravings for sugary foods in addition to nicotine.  Mild weight gain.  Constipation.  Nausea.  Coughing or a sore throat.  Changes in how your medicines work in your body.  A depressed mood.  Difficulty sleeping (insomnia).  After the first 2-3 weeks of quitting, you may start  to notice more positive results, such as:  Improved sense of smell and taste.  Decreased coughing and sore throat.  Slower heart rate.  Lower blood pressure.  Clearer skin.  The ability to breathe more easily.  Fewer sick days.  Quitting smoking is very challenging for most people. Do not get discouraged if you are not successful the first time. Some people need to make many attempts to quit before they achieve long-term success. Do your best to stick to your quit plan, and talk with your health care provider if you have any questions or concerns. This information is not intended to replace advice given to you by your health care provider. Make sure you discuss any questions you have with your health care provider. Document Released: 05/04/2001 Document Revised: 01/06/2016 Document Reviewed: 09/24/2014 [employer]vier Interactive Patient Education  [employer].

## 2017-12-06 ENCOUNTER — Ambulatory Visit (INDEPENDENT_AMBULATORY_CARE_PROVIDER_SITE_OTHER): Payer: Medicare Other

## 2017-12-06 DIAGNOSIS — I10 Essential (primary) hypertension: Secondary | ICD-10-CM | POA: Diagnosis not present

## 2017-12-06 NOTE — Progress Notes (Signed)
Patient presents to the office for a BP recheck. Today's reading was 122/54. Blood pressure taken in sitting position and in the left arm. Patient hasn't had any changes to his medication. Results given to provider and advised patient to continue to monitor.

## 2018-01-27 ENCOUNTER — Other Ambulatory Visit: Payer: Self-pay | Admitting: Internal Medicine

## 2018-02-07 DIAGNOSIS — Z23 Encounter for immunization: Secondary | ICD-10-CM | POA: Diagnosis not present

## 2018-03-03 ENCOUNTER — Ambulatory Visit (INDEPENDENT_AMBULATORY_CARE_PROVIDER_SITE_OTHER): Payer: Medicare Other | Admitting: Physician Assistant

## 2018-03-03 ENCOUNTER — Encounter: Payer: Self-pay | Admitting: Physician Assistant

## 2018-03-03 VITALS — BP 136/80 | HR 69 | Temp 98.2°F | Resp 16 | Ht 70.0 in | Wt 187.6 lb

## 2018-03-03 DIAGNOSIS — I10 Essential (primary) hypertension: Secondary | ICD-10-CM | POA: Diagnosis not present

## 2018-03-03 DIAGNOSIS — Z0000 Encounter for general adult medical examination without abnormal findings: Secondary | ICD-10-CM

## 2018-03-03 DIAGNOSIS — E559 Vitamin D deficiency, unspecified: Secondary | ICD-10-CM | POA: Diagnosis not present

## 2018-03-03 DIAGNOSIS — F1010 Alcohol abuse, uncomplicated: Secondary | ICD-10-CM

## 2018-03-03 DIAGNOSIS — R7309 Other abnormal glucose: Secondary | ICD-10-CM | POA: Diagnosis not present

## 2018-03-03 DIAGNOSIS — E663 Overweight: Secondary | ICD-10-CM

## 2018-03-03 DIAGNOSIS — F17210 Nicotine dependence, cigarettes, uncomplicated: Secondary | ICD-10-CM | POA: Diagnosis not present

## 2018-03-03 DIAGNOSIS — Z79899 Other long term (current) drug therapy: Secondary | ICD-10-CM

## 2018-03-03 DIAGNOSIS — Z0001 Encounter for general adult medical examination with abnormal findings: Secondary | ICD-10-CM

## 2018-03-03 DIAGNOSIS — R6889 Other general symptoms and signs: Secondary | ICD-10-CM

## 2018-03-03 DIAGNOSIS — E782 Mixed hyperlipidemia: Secondary | ICD-10-CM | POA: Diagnosis not present

## 2018-03-03 LAB — CBC WITH DIFFERENTIAL/PLATELET
BASOS PCT: 0.7 %
Basophils Absolute: 50 cells/uL (ref 0–200)
EOS ABS: 86 {cells}/uL (ref 15–500)
Eosinophils Relative: 1.2 %
HCT: 45.4 % (ref 38.5–50.0)
Hemoglobin: 15.5 g/dL (ref 13.2–17.1)
LYMPHS ABS: 2218 {cells}/uL (ref 850–3900)
MCH: 31.5 pg (ref 27.0–33.0)
MCHC: 34.1 g/dL (ref 32.0–36.0)
MCV: 92.3 fL (ref 80.0–100.0)
MPV: 11.8 fL (ref 7.5–12.5)
Monocytes Relative: 9 %
Neutro Abs: 4198 cells/uL (ref 1500–7800)
Neutrophils Relative %: 58.3 %
Platelets: 183 10*3/uL (ref 140–400)
RBC: 4.92 10*6/uL (ref 4.20–5.80)
RDW: 12.6 % (ref 11.0–15.0)
TOTAL LYMPHOCYTE: 30.8 %
WBC: 7.2 10*3/uL (ref 3.8–10.8)
WBCMIX: 648 {cells}/uL (ref 200–950)

## 2018-03-03 LAB — COMPLETE METABOLIC PANEL WITH GFR
AG Ratio: 2.5 (calc) (ref 1.0–2.5)
ALBUMIN MSPROF: 4.9 g/dL (ref 3.6–5.1)
ALT: 20 U/L (ref 9–46)
AST: 27 U/L (ref 10–35)
Alkaline phosphatase (APISO): 55 U/L (ref 40–115)
BILIRUBIN TOTAL: 1.1 mg/dL (ref 0.2–1.2)
BUN: 14 mg/dL (ref 7–25)
CALCIUM: 10 mg/dL (ref 8.6–10.3)
CO2: 27 mmol/L (ref 20–32)
CREATININE: 1.03 mg/dL (ref 0.70–1.25)
Chloride: 103 mmol/L (ref 98–110)
GFR, EST AFRICAN AMERICAN: 85 mL/min/{1.73_m2} (ref >=60)
GFR, EST NON AFRICAN AMERICAN: 74 mL/min/{1.73_m2} (ref >=60)
GLUCOSE: 95 mg/dL (ref 65–99)
Globulin: 2 g/dL (calc) (ref 1.9–3.7)
Potassium: 5 mmol/L (ref 3.5–5.3)
Sodium: 140 mmol/L (ref 135–146)
TOTAL PROTEIN: 6.9 g/dL (ref 6.1–8.1)

## 2018-03-03 LAB — LIPID PANEL
CHOL/HDL RATIO: 2.3 (calc) (ref <5.0)
Cholesterol: 153 mg/dL (ref <200)
HDL: 66 mg/dL (ref >40)
LDL CHOLESTEROL (CALC): 69 mg/dL
NON-HDL CHOLESTEROL (CALC): 87 mg/dL (ref <130)
TRIGLYCERIDES: 99 mg/dL (ref <150)

## 2018-03-03 LAB — TSH: TSH: 1.79 mIU/L (ref 0.40–4.50)

## 2018-03-03 NOTE — Patient Instructions (Addendum)
Eye doctors that you can call, they are all very close to our office  Dr. Delman Cheadle [phone] Dr. Bing Plume [phone] Dr. Herbert Deaner Badin W. Lady Gary, [place] Sharolyn Douglas, [phone]     INFORMATION ABOUT YOUR XRAY  [facility] [place], go to radiology and give them your name. They will have the order and take you back. You do not any paper work, I should get the result back today or tomorrow. This order is good for a year.    [place]  [employer]  [phone] for more information or for a free program for smoking cessation help.   You can call QUIT SMART 1-800-QUIT-NOW for free nicotine patches or replacement therapy- if they are out- keep calling  [facility] Can call for smoking cessation classes, [phone]  If you have a smart phone, please look up Smoke Free app, this will help you stay on track and give you information about money you have saved, life that you have gained back and a ton of more information.   We are giving you chantix for smoking cessation. You can do it! And we are here to help! You may have heard some scary side effects about chantix, the three most common I hear about are nausea, crazy dreams and depression.  However, I like for my patients to try to stay on 1/2 a tablet twice a day rather than one tablet twice a day as normally prescribed. This helps decrease the chances of side effects and helps save money by making a one month prescription last two months  Please start the prescription this way:  Start 1/2 tablet by mouth once daily after food with a full glass of water for 3 days Then do 1/2 tablet by mouth twice daily for 4 days. During this first week you can smoke, but try to stop after this week.  At this point we have several options: 1) continue on 1/2 tablet twice a day- which I encourage you to do.  You can stay on this dose the rest of the time on the medication or if you still feel the need to smoke you can do one of the two options below. 2) do one tablet in the morning and 1/2 in the evening which helps decrease dreams. 3) do one tablet twice a day.   What if I miss a dose? If you miss a dose, take it as soon as you can. If it is almost time for your next dose, take only that dose. Do not take double or extra doses.  What should I watch for while using this medicine? Visit your doctor or health care professional for regular check ups. Ask for ongoing advice and encouragement from your doctor or healthcare professional, friends, and family to help you quit. If you smoke while on this medication, quit again  Your mouth may get dry. Chewing sugarless gum or hard candy, and drinking plenty of water may help. Contact your doctor if the problem does not go away or is severe.  You may get drowsy or dizzy. Do not drive, use machinery, or do anything that needs mental alertness until you know how this medicine affects you. Do not stand or sit up quickly, especially if you are an older patient.   The use of this medicine may increase the chance of suicidal  thoughts or actions. Pay special attention to how you are responding while on this medicine. Any worsening of mood, or thoughts of suicide or dying should be reported to your health care professional right away.  ADVANTAGES OF QUITTING SMOKING  Within 20 minutes, blood pressure decreases. Your pulse is at normal level.  After 8 hours, carbon monoxide levels in the blood return to normal. Your oxygen level increases.  After 24 hours, the chance of having a heart attack starts to decrease. Your breath, hair, and body stop smelling like smoke.  After 48 hours, damaged nerve endings begin to recover. Your sense of taste and smell improve.  After 72 hours, the body is virtually free of nicotine. Your bronchial tubes relax and breathing becomes  easier.  After 2 to 12 weeks, lungs can hold more air. Exercise becomes easier and circulation improves.  After 1 year, the risk of coronary heart disease is cut in half.  After 5 years, the risk of stroke falls to the same as a nonsmoker.  After 10 years, the risk of lung cancer is cut in half and the risk of other cancers decreases significantly.  After 15 years, the risk of coronary heart disease drops, usually to the level of a nonsmoker.  You will have extra money to spend on things other than cigarettes.       Stroke Prevention Some medical conditions and behaviors are associated with a higher chance of having a stroke. You can help prevent a stroke by making nutrition, lifestyle, and other changes, including managing any medical conditions you may have. What nutrition changes can be made?  Eat healthy foods. You can do this by: ? Choosing foods high in fiber, such as fresh fruits and vegetables and whole grains. ? Eating at least 5 or more servings of fruits and vegetables a day. Try to fill half of your plate at each meal with fruits and vegetables. ? Choosing lean protein foods, such as lean cuts of meat, poultry without skin, fish, tofu, beans, and nuts. ? Eating low-fat dairy products. ? Avoiding foods that are high in salt (sodium). This can help lower blood pressure. ? Avoiding foods that have saturated fat, trans fat, and cholesterol. This can help prevent high cholesterol. ? Avoiding processed and premade foods.  Follow your health care provider's specific guidelines for losing weight, controlling high blood pressure (hypertension), lowering high cholesterol, and managing diabetes. These may include: ? Reducing your daily calorie intake. ? Limiting your daily sodium intake to 1,500 milligrams (mg). ? Using only healthy fats for cooking, such as olive oil, canola oil, or sunflower oil. ? Counting your daily carbohydrate intake. What lifestyle changes can be  made?  Maintain a healthy weight. Talk to your health care provider about your ideal weight.  Get at least 30 minutes of moderate physical activity at least 5 days a week. Moderate activity includes brisk walking, biking, and swimming.  Do not use any products that contain nicotine or tobacco, such as cigarettes and e-cigarettes. If you need help quitting, ask your health care provider. It may also be helpful to avoid exposure to secondhand smoke.  Limit alcohol intake to no more than 1 drink a day for nonpregnant women and 2 drinks a day for men. One drink equals 12 oz of beer, 5 oz of wine, or 1 oz of hard liquor.  Stop any illegal drug use.  Avoid taking birth control pills. Talk to your health care provider about the risks of taking  birth control pills if: ? You are over 66 years old. ? You smoke. ? You get migraines. ? You have ever had a blood clot. What other changes can be made?  Manage your cholesterol levels. ? Eating a healthy diet is important for preventing high cholesterol. If cholesterol cannot be managed through diet alone, you may also need to take medicines. ? Take any prescribed medicines to control your cholesterol as told by your health care provider.  Manage your diabetes. ? Eating a healthy diet and exercising regularly are important parts of managing your blood sugar. If your blood sugar cannot be managed through diet and exercise, you may need to take medicines. ? Take any prescribed medicines to control your diabetes as told by your health care provider.  Control your hypertension. ? To reduce your risk of stroke, try to keep your blood pressure below 130/80. ? Eating a healthy diet and exercising regularly are an important part of controlling your blood pressure. If your blood pressure cannot be managed through diet and exercise, you may need to take medicines. ? Take any prescribed medicines to control hypertension as told by your health care  provider. ? Ask your health care provider if you should monitor your blood pressure at home. ? Have your blood pressure checked every year, even if your blood pressure is normal. Blood pressure increases with age and some medical conditions.  Get evaluated for sleep disorders (sleep apnea). Talk to your health care provider about getting a sleep evaluation if you snore a lot or have excessive sleepiness.  Take over-the-counter and prescription medicines only as told by your health care provider. Aspirin or blood thinners (antiplatelets or anticoagulants) may be recommended to reduce your risk of forming blood clots that can lead to stroke.  Make sure that any other medical conditions you have, such as atrial fibrillation or atherosclerosis, are managed. What are the warning signs of a stroke? The warning signs of a stroke can be easily remembered as BEFAST.  B is for balance. Signs include: ? Dizziness. ? Loss of balance or coordination. ? Sudden trouble walking.  E is for eyes. Signs include: ? A sudden change in vision. ? Trouble seeing.  F is for face. Signs include: ? Sudden weakness or numbness of the face. ? The face or eyelid drooping to one side.  A is for arms. Signs include: ? Sudden weakness or numbness of the arm, usually on one side of the body.  S is for speech. Signs include: ? Trouble speaking (aphasia). ? Trouble understanding.  T is for time. ? These symptoms may represent a serious problem that is an emergency. Do not wait to see if the symptoms will go away. Get medical help right away. Call your local emergency services (911 in the [place] Do not drive yourself to the hospital.  Other signs of stroke may include: ? A sudden, severe headache with no known cause. ? Nausea or vomiting. ? Seizure.  Where to find more information: For more information, visit:  American Stroke Association: www.strokeassociation.org  National Stroke Association:  www.stroke.org  Summary  You can prevent a stroke by eating healthy, exercising, not smoking, limiting alcohol intake, and managing any medical conditions you may have.  Do not use any products that contain nicotine or tobacco, such as cigarettes and e-cigarettes. If you need help quitting, ask your health care provider. It may also be helpful to avoid exposure to secondhand smoke.  Remember BEFAST for warning signs of stroke.  Get help right away if you or a loved one has any of these signs. This information is not intended to replace advice given to you by your health care provider. Make sure you discuss any questions you have with your health care provider. Document Released: 06/17/2004 Document Revised: 06/15/2016 Document Reviewed: 06/15/2016 Elsevier Interactive Patient Education  [employer].   Here is some information to help you keep your heart healthy: Move it! - Aim for 30 mins of activity every day. Take it slowly at first. Talk to [place] before starting any new exercise program.   Lose it.  -Body Mass Index (BMI) can indicate if you need to lose weight. A healthy range is 18.5-24.9. For a BMI calculator, go to [employer]  Waist Management -Excess abdominal fat is a risk factor for heart disease, diabetes, asthma, stroke and more. Ideal waist circumference is less than 35" for women and less than 40" for men.   Eat Right -focus on fruits, vegetables, whole grains, and meals you make yourself. Avoid foods with trans fat and high sugar/sodium content.   Snooze or Snore? - Loud snoring can be a sign of sleep apnea, a significant risk factor for high blood pressure, heart attach, stroke, and heart arrhythmias.  Kick the habit -Quit Smoking! Avoid second hand smoke. A single cigarette raises your blood pressure for 20 mins and increases the risk of heart attack and stroke for the next 24 hours.   Are Aspirin and Supplements right for you? -Add ENTERIC COATED low dose 81  mg Aspirin daily OR can do every other day if you have easy bruising to protect your heart and head. As well as to reduce risk of Colon Cancer by 20 %, Skin Cancer by 26 % , Melanoma by 46% and Pancreatic cancer by 60%  Say "No to Stress -There may be little you can do about problems that cause stress. However, techniques such as long walks, meditation, and exercise can help you manage it.   Start Now! - Make changes one at a time and set reasonable goals to increase your likelihood of success.

## 2018-03-03 NOTE — Progress Notes (Signed)
MEDICARE ANNUAL WELLNESS VISIT AND FOLLOW UP Assessment:    Essential hypertension - continue medications, DASH diet, exercise and monitor at home. Call if greater than 130/80.  -     CBC with Differential/Platelet -     BASIC METABOLIC PANEL WITH GFR -     Hepatic function panel  Cigarette nicotine dependence without complication Smoking cessation-  instruction/counseling given, counseled patient on the dangers of tobacco use, advised patient to stop smoking, and reviewed strategies to maximize success, patient not ready to quit at this time.   Alcohol abuse Counseled on decreasing, no hx of DTS  Prediabetes Discussed general issues about diabetes pathophysiology and management., Educational material distributed., Suggested low cholesterol diet., Encouraged aerobic exercise., Discussed foot care., Reminded to get yearly retinal exam.  Mixed hyperlipidemia -continue medications, check lipids, decrease fatty foods, increase activity.  -     Lipid panel  Medication management Monitor  Vitamin D deficiency Continue supplement  BMI 25.0-25.9,adult  Encounter for Medicare annual wellness exam 1 year   Over 30 minutes of exam, counseling, chart review, and critical decision making was performed Future Appointments  Date Time Provider [place]  03/03/2018 10:15 AM Vicie Mutters, PA-C [facility] None  08/31/2018 10:00 AM Unk Pinto, MD [facility] None    Plan:   During the course of the visit the patient was educated and counseled about appropriate screening and preventive services including:    Pneumococcal vaccine   Influenza vaccine  Prevnar 13  Td vaccine  Screening electrocardiogram  Colorectal cancer screening  Diabetes screening  Glaucoma screening  Nutrition counseling     Subjective:  Michael Fields is a 67 y.o. male who presents for Medicare Annual Wellness Visit and 3 month follow up for HTN, hyperlipidemia, prediabetes, and  vitamin D Def.   His blood pressure has been controlled at home, today their BP is BP: 136/80 He does not workout, does yard work. He denies chest pain, shortness of breath, dizziness.    He is on cholesterol medication, crestor 40 will occ miss a day and denies myalgias. His cholesterol is not at goal. The cholesterol last visit was:   Lab Results  Component Value Date   CHOL 221 (H) 11/29/2017   HDL 58 11/29/2017   LDLCALC 127 (H) 11/29/2017   TRIG 219 (H) 11/29/2017   CHOLHDL 3.8 11/29/2017   He has been working on diet and exercise for prediabetes, and denies foot ulcerations, hyperglycemia, hypoglycemia , increased appetite, nausea, paresthesia of the feet, polydipsia, polyuria, visual disturbances, vomiting and weight loss. Last A1C in the office was:  Lab Results  Component Value Date   HGBA1C 5.5 08/02/2017   Last GFR Lab Results  Component Value Date   GFRNONAA 87 11/29/2017   Patient is on Vitamin D supplement.   Lab Results  Component Value Date   VD25OH 38 08/02/2017     BMI is Body mass index is 26.92 kg/m., he is working on diet and exercise. Wt Readings from Last 3 Encounters:  03/03/18 187 lb 9.6 oz (85.1 kg)  11/29/17 189 lb (85.7 kg)  08/02/17 193 lb 9.6 oz (87.8 kg)     Medication Review: Current Outpatient Medications on File Prior to Visit  Medication Sig Dispense Refill  . aspirin EC 81 MG tablet Take 81 mg by mouth daily.    . bisoprolol-hydrochlorothiazide (ZIAC) 5-6.25 MG tablet TAKE 1 TABLET BY MOUTH DAILY. 90 tablet 1  . Cholecalciferol (VITAMIN D PO) Take 5,000 Units by mouth  2 (two) times daily.    . rosuvastatin (CRESTOR) 40 MG tablet TAKE 1/2 TO 1 TABLET DAILY OR AS DIRECTED FOR CHOLESTEROL 90 tablet 1   No current facility-administered medications on file prior to visit.     Current Problems (verified) Patient Active Problem List   Diagnosis Date Noted  . Overweight (BMI 25.0-29.9) 02/05/2015  . Medication management 10/30/2014  .  Mixed hyperlipidemia 06/24/2014  . HTN 05/14/2014  . Other abnormal glucose 05/14/2014  . Vitamin D deficiency 05/14/2014  . Nicotine dependence 01/17/2014  . Alcohol abuse 01/17/2014    Screening Tests Immunization History  Administered Date(s) Administered  . Influenza-Unspecified 01/06/2016, 12/31/2016  . Pneumococcal Conjugate-13 10/01/2015  . Pneumococcal Polysaccharide-23 01/26/2017  . Tdap 01/14/2014   Preventative care: Last colonoscopy: 02/2016 CXR has been a long time, smokes x 55 years  Prior vaccinations: TD or Tdap: 09/02/2013  Influenza: 09-02-2017 AT [facility] Pneumococcal: 09/02/16 Prevnar13: 09/03/15 Shingles/Zostavax: Declined  Names of Other Physician/Practitioners you currently use: 1. [facility] here for primary care 2. Does not see one, eye doctor, last visit NONE 3. Does not see one, dentist, last visit NONE Patient Care Team: Unk Pinto, MD as PCP - General (Internal Medicine) Charlotte Crumb, MD as Consulting Physician (Orthopedic Surgery) Altamese [place], MD as Consulting Physician (Orthopedic Surgery)  Allergies No Known Allergies  SURGICAL HISTORY He  has a past surgical history that includes Brain surgery; Hernia repair 09-03-10); ORIF wrist fracture (Right, 04/02/2013); ORIF acetabular fracture (Right, 01/15/2014); and Hip surgery. FAMILY HISTORY His family history is not on file.  States does not know FM history, never went to doctors- 2 brother dead age 41 and 39 and one brother 12, and has 3 sisters, 1 died 02-Sep-2017.  SOCIAL HISTORY He  reports that he has been smoking cigarettes. He has been smoking about 1.00 pack per day. He has never used smokeless tobacco. He reports that he drinks about 24.0 standard drinks of alcohol per week. He reports that he does not use drugs. 6-10 beers, will vary.   MEDICARE WELLNESS OBJECTIVES: Physical activity: Current Exercise Habits: The patient does not participate in regular exercise at  present(does house work) Cardiac risk factors: Cardiac Risk Factors include: advanced age (>42men, >26 women);diabetes mellitus;dyslipidemia;hypertension;male gender;sedentary lifestyle;smoking/ tobacco exposure Depression/mood screen:   Depression screen [facility] 2/9 03/03/2018  Decreased Interest 0  Down, Depressed, Hopeless 0  PHQ - 2 Score 0    ADLs:  In your present state of health, do you have any difficulty performing the following activities: 03/03/2018 08/02/2017  Hearing? N N  Vision? N N  Difficulty concentrating or making decisions? N N  Walking or climbing stairs? N N  Dressing or bathing? N N  Doing errands, shopping? N N  Some recent data might be hidden     Cognitive Testing  Alert? Yes  Normal Appearance?Yes  Oriented to person? Yes  Place? Yes   Time? Yes  Recall of three objects?  Yes  Can perform simple calculations? Yes  Displays appropriate judgment?Yes  Can read the correct time from a watch face?Yes  EOL planning: Does Patient Have a Medical Advance Directive?: No Would patient like information on creating a medical advance directive?: Yes (MAU/Ambulatory/Procedural Areas - Information given)   Objective:   Today's Vitals   03/03/18 1003  BP: 136/80  Pulse: 69  Resp: 16  Temp: 98.2 F (36.8 C)  SpO2: 98%  Weight: 187 lb 9.6 oz (85.1 kg)  Height: 5\' 10"  (  1.778 m)  PainSc: 0-No pain   Body mass index is 26.92 kg/m.  General appearance: alert, no distress, WD/WN, male HEENT: normocephalic, sclerae anicteric, TMs pearly, nares patent, no discharge or erythema, pharynx normal Oral cavity: no lesions, upper dentures, poor dentition Neck: supple, no lymphadenopathy, no thyromegaly, no masses Heart: RRR, normal S1, S2, no murmurs Lungs: CTA bilaterally, no wheezes, rhonchi, or rales Abdomen: +bs, soft, non tender, non distended, no masses, no hepatomegaly, no splenomegaly Musculoskeletal: nontender, no swelling, no obvious deformity Extremities: no  edema, no cyanosis, no clubbing Pulses: 2+ symmetric, upper and lower extremities, normal cap refill Neurological: alert, oriented x 3, CN2-12 intact, strength normal upper extremities and lower extremities, sensation normal throughout, DTRs 2+ throughout, no cerebellar signs, gait normal Psychiatric: normal affect, behavior normal, pleasant   Medicare Attestation I have personally reviewed: The patient's medical and social history Their use of alcohol, tobacco or illicit drugs Their current medications and supplements The patient's functional ability including ADLs,fall risks, home safety risks, cognitive, and hearing and visual impairment Diet and physical activities Evidence for depression or mood disorders  The patient's weight, height, BMI, and visual acuity have been recorded in the chart.  I have made referrals, counseling, and provided education to the patient based on review of the above and I have provided the patient with a written personalized care plan for preventive services.     Vicie Mutters, PA-C   03/03/2018

## 2018-03-06 ENCOUNTER — Ambulatory Visit (HOSPITAL_COMMUNITY)
Admission: RE | Admit: 2018-03-06 | Discharge: 2018-03-06 | Disposition: A | Discharge status: Home / Self Care | Payer: Medicare Other | Source: Ambulatory Visit | Attending: Physician Assistant | Admitting: Physician Assistant

## 2018-03-06 DIAGNOSIS — F17210 Nicotine dependence, cigarettes, uncomplicated: Secondary | ICD-10-CM

## 2018-03-06 DIAGNOSIS — I10 Essential (primary) hypertension: Secondary | ICD-10-CM | POA: Insufficient documentation

## 2018-03-06 DIAGNOSIS — J449 Chronic obstructive pulmonary disease, unspecified: Secondary | ICD-10-CM | POA: Insufficient documentation

## 2018-03-06 DIAGNOSIS — F17200 Nicotine dependence, unspecified, uncomplicated: Secondary | ICD-10-CM | POA: Insufficient documentation

## 2018-03-06 DIAGNOSIS — J439 Emphysema, unspecified: Secondary | ICD-10-CM | POA: Diagnosis not present

## 2018-03-07 ENCOUNTER — Encounter: Payer: Self-pay | Admitting: Physician Assistant

## 2018-03-07 ENCOUNTER — Ambulatory Visit: Payer: Self-pay | Admitting: Internal Medicine

## 2018-03-07 DIAGNOSIS — J449 Chronic obstructive pulmonary disease, unspecified: Secondary | ICD-10-CM | POA: Insufficient documentation

## 2018-03-16 ENCOUNTER — Other Ambulatory Visit: Payer: Self-pay | Admitting: Adult Health

## 2018-07-27 ENCOUNTER — Other Ambulatory Visit: Payer: Self-pay | Admitting: Internal Medicine

## 2018-08-30 ENCOUNTER — Encounter: Payer: Self-pay | Admitting: Internal Medicine
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
## 2018-08-30 NOTE — Patient Instructions (Signed)
Coronavirus (COVID-19) Are you at risk?  Are you at risk for the Coronavirus (COVID-19)?  To be considered HIGH RISK for Coronavirus (COVID-19), you have to meet the following criteria:  . Traveled to [place], [place], [place], [place] or [place]; or in the [place] to [place], [place], [place]  . or [place]; and have fever, cough, and shortness of breath within the last 2 weeks of travel OR . Been in close contact with a person diagnosed with COVID-19 within the last 2 weeks and have  . fever, cough,and shortness of breath .  . IF YOU DO NOT MEET THESE CRITERIA, YOU ARE CONSIDERED LOW RISK FOR COVID-19.  What to do if you are HIGH RISK for COVID-19?  . If you are having a medical emergency, call 911. . Seek medical care right away. Before you go to a doctor's office, urgent care or emergency department, .  call ahead and tell them about your recent travel, contact with someone diagnosed with COVID-19  .  and your symptoms.  . You should receive instructions from your physician's office regarding next steps of care.  . When you arrive at healthcare provider, tell the healthcare staff immediately you have returned from  . visiting [place], [place], [place], [place] or [place]; or traveled in the [place] to [place], [place],  . [place] or [place] in the last two weeks or you have been in close contact with a person diagnosed with  . COVID-19 in the last 2 weeks.   . Tell the health care staff about your symptoms: fever, cough and shortness of breath. . After you have been seen by a medical provider, you will be either: o Tested for (COVID-19) and discharged home on quarantine except to seek medical care if  o symptoms worsen, and asked to  - Stay home and avoid contact with others until you get your results (4-5 days)  - Avoid travel on public transportation if possible (such as bus, train, or airplane) or o Sent to the Emergency Department by EMS for evaluation,  COVID-19 testing  and  o possible admission depending on your condition and test results.  What to do if you are LOW RISK for COVID-19?  Reduce your risk of any infection by using the same precautions used for avoiding the common cold or flu:  . Wash your hands often with soap and warm water for at least 20 seconds.  If soap and water are not readily available,  . use an alcohol-based hand sanitizer with at least 60% alcohol.  . If coughing or sneezing, cover your mouth and nose by coughing or sneezing into the elbow areas of your shirt or coat, .  into a tissue or into your sleeve (not your hands). . Avoid shaking hands with others and consider head nods or verbal greetings only. . Avoid touching your eyes, nose, or mouth with unwashed hands.  . Avoid close contact with people who are sick. . Avoid places or events with large numbers of people in one location, like concerts or sporting events. . Carefully consider travel plans you have or are making. . If you are planning any travel outside or inside the [place], visit the CDC's Travelers' Health webpage for the latest health notices. . If you have some symptoms but not all symptoms, continue to monitor at home and seek medical attention  . if your symptoms worsen. . If you are having a medical emergency, call 911. >>>>>>>>>>>>>>>>>>>>>>>>>>>>>>>>>>>>>>>>>>>>>>>>>>>>>>>   We Do NOT Approve of  [facility] [place] Soliciting Our Patients  To Do Home Visits  & We Do NOT Approve of [employer] > > > > > > > > > > > > > > > > > > > > > > > > > > > > > > > > > > >  > > > >   Preventive Care for Adults  A healthy lifestyle and preventive care can promote health and wellness. Preventive health guidelines for men include the following key practices:  A routine yearly physical is a good way to check with your health care provider about your health and preventative screening. It is a chance to share any concerns and updates on  your health and to receive a thorough exam.  Visit your dentist for a routine exam and preventative care every 6 months. Brush your teeth twice a day and floss once a day. Good oral hygiene prevents tooth decay and gum disease.  The frequency of eye exams is based on your age, health, family medical history, use of contact lenses, and other factors. Follow your health care provider's recommendations for frequency of eye exams.  Eat a healthy diet. Foods such as vegetables, fruits, whole grains, low-fat dairy products, and lean protein foods contain the nutrients you need without too many calories. Decrease your intake of foods high in solid fats, added sugars, and salt. Eat the right amount of calories for you. Get information about a proper diet from your health care provider, if necessary.  Regular physical exercise is one of the most important things you can do for your health. Most adults should get at least 150 minutes of moderate-intensity exercise (any activity that increases your heart rate and causes you to sweat) each week. In addition, most adults need muscle-strengthening exercises on 2 or more days a week.  Maintain a healthy weight. The body mass index (BMI) is a screening tool to identify possible weight problems. It provides an estimate of body fat based on height and weight. Your health care provider can find your BMI and can help you achieve or maintain a healthy weight. For adults 20 years and older:  A BMI below 18.5 is considered underweight.  A BMI of 18.5 to 24.9 is normal.  A BMI of 25 to 29.9 is considered overweight.  A BMI of 30 and above is considered obese.  Maintain normal blood lipids and cholesterol levels by exercising and minimizing your intake of saturated fat. Eat a balanced diet with plenty of fruit and vegetables. Blood tests for lipids and cholesterol should begin at age 20 and be repeated every 5 years. If your lipid or cholesterol levels are high, you are  over 50, or you are at high risk for heart disease, you may need your cholesterol levels checked more frequently. Ongoing high lipid and cholesterol levels should be treated with medicines if diet and exercise are not working.  If you smoke, find out from your health care provider how to quit. If you do not use tobacco, do not start.  Lung cancer screening is recommended for adults aged 55-80 years who are at high risk for developing lung cancer because of a history of smoking. A yearly low-dose CT scan of the lungs is recommended for people who have at least a 30-pack-year history of smoking and are a current smoker or have quit within the past 15 years. A pack year of smoking is smoking an average of 1   pack of cigarettes a day for 1 year (for example: 1 pack a day for 30 years or 2 packs a day for 15 years). Yearly screening should continue until the smoker has stopped smoking for at least 15 years. Yearly screening should be stopped for people who develop a health problem that would prevent them from having lung cancer treatment.  If you choose to drink alcohol, do not have more than 2 drinks per day. One drink is considered to be 12 ounces (355 mL) of beer, 5 ounces (148 mL) of wine, or 1.5 ounces (44 mL) of liquor.  Avoid use of street drugs. Do not share needles with anyone. Ask for help if you need support or instructions about stopping the use of drugs.  High blood pressure causes heart disease and increases the risk of stroke. Your blood pressure should be checked at least every 1-2 years. Ongoing high blood pressure should be treated with medicines, if weight loss and exercise are not effective.  If you are 45-79 years old, ask your health care provider if you should take aspirin to prevent heart disease.  Diabetes screening involves taking a blood sample to check your fasting blood sugar level. Testing should be considered at a younger age or be carried out more frequently if you are  overweight and have at least 1 risk factor for diabetes.  Colorectal cancer can be detected and often prevented. Most routine colorectal cancer screening begins at the age of 50 and continues through age 75. However, your health care provider may recommend screening at an earlier age if you have risk factors for colon cancer. On a yearly basis, your health care provider may provide home test kits to check for hidden blood in the stool. Use of a small camera at the end of a tube to directly examine the colon (sigmoidoscopy or colonoscopy) can detect the earliest forms of colorectal cancer. Talk to your health care provider about this at age 50, when routine screening begins. Direct exam of the colon should be repeated every 5-10 years through age 75, unless early forms of precancerous polyps or small growths are found.  Hepatitis C blood testing is recommended for all people born from 1945 through 1965 and any individual with known risks for hepatitis C.  Screening for abdominal aortic aneurysm (AAA)  by ultrasound is recommended for people who have history of high blood pressure or who are current or former smokers.  Healthy men should  receive prostate-specific antigen (PSA) blood tests as part of routine cancer screening. Talk with your health care provider about prostate cancer screening.  Testicular cancer screening is  recommended for adult males. Screening includes self-exam, a health care provider exam, and other screening tests. Consult with your health care provider about any symptoms you have or any concerns you have about testicular cancer.  Use sunscreen. Apply sunscreen liberally and repeatedly throughout the day. You should seek shade when your shadow is shorter than you. Protect yourself by wearing long sleeves, pants, a wide-brimmed hat, and sunglasses year round, whenever you are outdoors.  Once a month, do a whole-body skin exam, using a mirror to look at the skin on your back. Tell  your health care provider about new moles, moles that have irregular borders, moles that are larger than a pencil eraser, or moles that have changed in shape or color.  Stay current with required vaccines (immunizations).  Influenza vaccine. All adults should be immunized every year.  Tetanus, diphtheria, and acellular   pertussis (Td, Tdap) vaccine. An adult who has not previously received Tdap or who does not know his vaccine status should receive 1 dose of Tdap. This initial dose should be followed by tetanus and diphtheria toxoids (Td) booster doses every 10 years. Adults with an unknown or incomplete history of completing a 3-dose immunization series with Td-containing vaccines should begin or complete a primary immunization series including a Tdap dose. Adults should receive a Td booster every 10 years.  Zoster vaccine. One dose is recommended for adults aged 60 years or older unless certain conditions are present.    PREVNAR - Pneumococcal 13-valent conjugate (PCV13) vaccine. When indicated, a person who is uncertain of his immunization history and has no record of immunization should receive the PCV13 vaccine. An adult aged 19 years or older who has certain medical conditions and has not been previously immunized should receive 1 dose of PCV13 vaccine. This PCV13 should be followed with a dose of pneumococcal polysaccharide (PPSV23) vaccine. The PPSV23 vaccine dose should be obtained 1 or more year(s)after the dose of PCV13 vaccine. An adult aged 19 years or older who has certain medical conditions and previously received 1 or more doses of PPSV23 vaccine should receive 1 dose of PCV13. The PCV13 vaccine dose should be obtained 1 or more years after the last PPSV23 vaccine dose.    PNEUMOVAX - Pneumococcal polysaccharide (PPSV23) vaccine. When PCV13 is also indicated, PCV13 should be obtained first. All adults aged 65 years and older should be immunized. An adult younger than age 65 years who  has certain medical conditions should be immunized. Any person who resides in a nursing home or long-term care facility should be immunized. An adult smoker should be immunized. People with an immunocompromised condition and certain other conditions should receive both PCV13 and PPSV23 vaccines. People with human immunodeficiency virus (HIV) infection should be immunized as soon as possible after diagnosis. Immunization during chemotherapy or radiation therapy should be avoided. Routine use of PPSV23 vaccine is not recommended for American Indians, [place] Natives, or people younger than 65 years unless there are medical conditions that require PPSV23 vaccine. When indicated, people who have unknown immunization and have no record of immunization should receive PPSV23 vaccine. One-time revaccination 5 years after the first dose of PPSV23 is recommended for people aged 19-64 years who have chronic kidney failure, nephrotic syndrome, asplenia, or immunocompromised conditions. People who received 1-2 doses of PPSV23 before age 65 years should receive another dose of PPSV23 vaccine at age 65 years or later if at least 5 years have passed since the previous dose. Doses of PPSV23 are not needed for people immunized with PPSV23 at or after age 65 years.    Hepatitis A vaccine. Adults who wish to be protected from this disease, have certain high-risk conditions, work with hepatitis A-infected animals, work in hepatitis A research labs, or travel to or work in countries with a high rate of hepatitis A should be immunized. Adults who were previously unvaccinated and who anticipate close contact with an international adoptee during the first 60 days after arrival in the [place] from a country with a high rate of hepatitis A should be immunized.    Hepatitis B vaccine. Adults should be immunized if they wish to be protected from this disease, have certain high-risk conditions, may be exposed to blood or other  infectious body fluids, are household contacts or sex partners of hepatitis B positive people, are clients or workers in certain care facilities,   or travel to or work in countries with a high rate of hepatitis B.   Preventive Service / Frequency   Ages 65 and over  Blood pressure check.  Lipid and cholesterol check.  Lung cancer screening. / Every year if you are aged 55-80 years and have a 30-pack-year history of smoking and currently smoke or have quit within the past 15 years. Yearly screening is stopped once you have quit smoking for at least 15 years or develop a health problem that would prevent you from having lung cancer treatment.  Fecal occult blood test (FOBT) of stool. You may not have to do this test if you get a colonoscopy every 10 years.  Flexible sigmoidoscopy** or colonoscopy.** / Every 5 years for a flexible sigmoidoscopy or every 10 years for a colonoscopy beginning at age 50 and continuing until age 75.  Hepatitis C blood test.** / For all people born from 1945 through 1965 and any individual with known risks for hepatitis C.  Abdominal aortic aneurysm (AAA) screening./ Screening current or former smokers or have Hypertension.  Skin self-exam. / Monthly.  Influenza vaccine. / Every year.  Tetanus, diphtheria, and acellular pertussis (Tdap/Td) vaccine.** / 1 dose of Td every 10 years.   Zoster vaccine.** / 1 dose for adults aged 60 years or older.         Pneumococcal 13-valent conjugate (PCV13) vaccine.    Pneumococcal polysaccharide (PPSV23) vaccine.     Hepatitis A vaccine.** / Consult your health care provider.  Hepatitis B vaccine.** / Consult your health care provider. Screening for abdominal aortic aneurysm (AAA)  by ultrasound is recommended for people who have history of high blood pressure or who are current or former smokers. ++++++++++ Recommend Adult Low Dose Aspirin or  coated  Aspirin 81 mg daily  To reduce risk of Colon Cancer 20 %,   Skin Cancer 26 % ,  Malignant Melanoma 46%  and  Pancreatic cancer 60% ++++++++++++++++++++++ Vitamin D goal  is between 70-100.  Please make sure that you are taking your Vitamin D as directed.  It is very important as a natural anti-inflammatory  helping hair, skin, and nails, as well as reducing stroke and heart attack risk.  It helps your bones and helps with mood. It also decreases numerous cancer risks so please take it as directed.  Low Vit D is associated with a 200-300% higher risk for CANCER  and 200-300% higher risk for HEART   ATTACK  &  STROKE.   ...................................... It is also associated with higher death rate at younger ages,  autoimmune diseases like Rheumatoid arthritis, Lupus, Multiple Sclerosis.    Also many other serious conditions, like depression, Alzheimer's Dementia, infertility, muscle aches, fatigue, fibromyalgia - just to name a few. ++++++++++++++++++++++ Recommend the book "The END of DIETING" by Dr Joel Fuhrman  & the book "The END of DIABETES " by Dr Joel Fuhrman At [employer] - get book & Audio CD's    Being diabetic has a  300% increased risk for heart attack, stroke, cancer, and alzheimer- type vascular dementia. It is very important that you work harder with diet by avoiding all foods that are white. Avoid white rice (brown & wild rice is OK), white potatoes (sweetpotatoes in moderation is OK), White bread or wheat bread or anything made out of white flour like bagels, donuts, rolls, buns, biscuits, cakes, pastries, cookies, pizza crust, and pasta (made from white flour & egg whites) - vegetarian pasta or spinach or wheat   pasta is OK. Multigrain breads like Arnold's or [employer], or multigrain sandwich thins or flatbreads.  Diet, exercise and weight loss can reverse and cure diabetes in the early stages.  Diet, exercise and weight loss is very important in the control and prevention of complications of diabetes which affects every  system in your body, ie. Brain - dementia/stroke, eyes - glaucoma/blindness, heart - heart attack/heart failure, kidneys - dialysis, stomach - gastric paralysis, intestines - malabsorption, nerves - severe painful neuritis, circulation - gangrene & loss of a leg(s), and finally cancer and Alzheimers.    I recommend avoid fried & greasy foods,  sweets/candy, white rice (brown or wild rice or Quinoa is OK), white potatoes (sweet potatoes are OK) - anything made from white flour - bagels, doughnuts, rolls, buns, biscuits,white and wheat breads, pizza crust and traditional pasta made of white flour & egg white(vegetarian pasta or spinach or wheat pasta is OK).  Multi-grain bread is OK - like multi-grain flat bread or sandwich thins. Avoid alcohol in excess. Exercise is also important.    Eat all the vegetables you want - avoid meat, especially red meat and dairy - especially cheese.  Cheese is the most concentrated form of trans-fats which is the worst thing to clog up our arteries. Veggie cheese is OK which can be found in the fresh produce section at [employer] or [employer] or [employer]  ++++++++++++++++++++++ DASH Eating Plan  DASH stands for "Dietary Approaches to Stop Hypertension."   The DASH eating plan is a healthy eating plan that has been shown to reduce high blood pressure (hypertension). Additional health benefits may include reducing the risk of type 2 diabetes mellitus, heart disease, and stroke. The DASH eating plan may also help with weight loss. WHAT DO I NEED TO KNOW ABOUT THE DASH EATING PLAN? For the DASH eating plan, you will follow these general guidelines:  Choose foods with a percent daily value for sodium of less than 5% (as listed on the food label).  Use salt-free seasonings or herbs instead of table salt or sea salt.  Check with your health care provider or pharmacist before using salt substitutes.  Eat lower-sodium products, often labeled as "lower sodium" or "no  salt added."  Eat fresh foods.  Eat more vegetables, fruits, and low-fat dairy products.  Choose whole grains. Look for the word "whole" as the first word in the ingredient list.  Choose fish   Limit sweets, desserts, sugars, and sugary drinks.  Choose heart-healthy fats.  Eat veggie cheese   Eat more home-cooked food and less restaurant, buffet, and fast food.  Limit fried foods.  Cook foods using methods other than frying.  Limit canned vegetables. If you do use them, rinse them well to decrease the sodium.  When eating at a restaurant, ask that your food be prepared with less salt, or no salt if possible.                      WHAT FOODS CAN I EAT? Read Dr Joel Fuhrman's books on The End of Dieting & The End of Diabetes  Grains Whole grain or whole wheat bread. Brown rice. Whole grain or whole wheat pasta. Quinoa, bulgur, and whole grain cereals. Low-sodium cereals. Corn or whole wheat flour tortillas. Whole grain cornbread. Whole grain crackers. Low-sodium crackers.  Vegetables Fresh or frozen vegetables (raw, steamed, roasted, or grilled). Low-sodium or reduced-sodium tomato and vegetable juices. Low-sodium or reduced-sodium tomato sauce and paste. Low-sodium   or reduced-sodium canned vegetables.   Fruits All fresh, canned (in natural juice), or frozen fruits.  Protein Products  All fish and seafood.  Dried beans, peas, or lentils. Unsalted nuts and seeds. Unsalted canned beans.  Dairy Low-fat dairy products, such as skim or 1% milk, 2% or reduced-fat cheeses, low-fat ricotta or cottage cheese, or plain low-fat yogurt. Low-sodium or reduced-sodium cheeses.  Fats and Oils Tub margarines without trans fats. Light or reduced-fat mayonnaise and salad dressings (reduced sodium). Avocado. Safflower, olive, or canola oils. Natural peanut or almond butter.  Other Unsalted popcorn and pretzels. The items listed above may not be a complete list of recommended foods or  beverages. Contact your dietitian for more options.  ++++++++++++++++++++  WHAT FOODS ARE NOT RECOMMENDED? Grains/ White flour or wheat flour White bread. White pasta. White rice. Refined cornbread. Bagels and croissants. Crackers that contain trans fat.  Vegetables  Creamed or fried vegetables. Vegetables in a . Regular canned vegetables. Regular canned tomato sauce and paste. Regular tomato and vegetable juices.  Fruits Dried fruits. Canned fruit in light or heavy syrup. Fruit juice.  Meat and Other Protein Products Meat in general - RED meat & White meat.  Fatty cuts of meat. Ribs, chicken wings, all processed meats as bacon, sausage, bologna, salami, fatback, hot dogs, bratwurst and packaged luncheon meats.  Dairy Whole or 2% milk, cream, half-and-half, and cream cheese. Whole-fat or sweetened yogurt. Full-fat cheeses or blue cheese. Non-dairy creamers and whipped toppings. Processed cheese, cheese spreads, or cheese curds.  Condiments Onion and garlic salt, seasoned salt, table salt, and sea salt. Canned and packaged gravies. Worcestershire sauce. Tartar sauce. Barbecue sauce. Teriyaki sauce. Soy sauce, including reduced sodium. Steak sauce. Fish sauce. Oyster sauce. Cocktail sauce. Horseradish. Ketchup and mustard. Meat flavorings and tenderizers. Bouillon cubes. Hot sauce. Tabasco sauce. Marinades. Taco seasonings. Relishes.  Fats and Oils Butter, stick margarine, lard, shortening and bacon fat. Coconut, palm kernel, or palm oils. Regular salad dressings.  Pickles and olives. Salted popcorn and pretzels.  The items listed above may not be a complete list of foods and beverages to avoid.    

## 2018-08-31 ENCOUNTER — Encounter: Payer: Self-pay | Admitting: Internal Medicine

## 2018-08-31 ENCOUNTER — Other Ambulatory Visit: Payer: Self-pay | Admitting: *Deleted

## 2018-08-31 ENCOUNTER — Ambulatory Visit (INDEPENDENT_AMBULATORY_CARE_PROVIDER_SITE_OTHER): Payer: Medicare Other | Admitting: Internal Medicine

## 2018-08-31 ENCOUNTER — Other Ambulatory Visit: Payer: Self-pay

## 2018-08-31 VITALS — BP 136/84 | HR 72 | Temp 97.2°F | Resp 16 | Ht 70.5 in | Wt 191.2 lb

## 2018-08-31 DIAGNOSIS — I700 Atherosclerosis of aorta: Secondary | ICD-10-CM

## 2018-08-31 DIAGNOSIS — R7303 Prediabetes: Secondary | ICD-10-CM | POA: Diagnosis not present

## 2018-08-31 DIAGNOSIS — E559 Vitamin D deficiency, unspecified: Secondary | ICD-10-CM

## 2018-08-31 DIAGNOSIS — Z79899 Other long term (current) drug therapy: Secondary | ICD-10-CM | POA: Diagnosis not present

## 2018-08-31 DIAGNOSIS — N401 Enlarged prostate with lower urinary tract symptoms: Secondary | ICD-10-CM | POA: Diagnosis not present

## 2018-08-31 DIAGNOSIS — R7309 Other abnormal glucose: Secondary | ICD-10-CM

## 2018-08-31 DIAGNOSIS — N138 Other obstructive and reflux uropathy: Secondary | ICD-10-CM | POA: Diagnosis not present

## 2018-08-31 DIAGNOSIS — Z1212 Encounter for screening for malignant neoplasm of rectum: Secondary | ICD-10-CM

## 2018-08-31 DIAGNOSIS — Z136 Encounter for screening for cardiovascular disorders: Secondary | ICD-10-CM

## 2018-08-31 DIAGNOSIS — Z125 Encounter for screening for malignant neoplasm of prostate: Secondary | ICD-10-CM | POA: Diagnosis not present

## 2018-08-31 DIAGNOSIS — E782 Mixed hyperlipidemia: Secondary | ICD-10-CM

## 2018-08-31 DIAGNOSIS — Z1211 Encounter for screening for malignant neoplasm of colon: Secondary | ICD-10-CM

## 2018-08-31 DIAGNOSIS — F17200 Nicotine dependence, unspecified, uncomplicated: Secondary | ICD-10-CM | POA: Diagnosis not present

## 2018-08-31 DIAGNOSIS — J320 Chronic maxillary sinusitis: Secondary | ICD-10-CM | POA: Diagnosis not present

## 2018-08-31 DIAGNOSIS — I10 Essential (primary) hypertension: Secondary | ICD-10-CM | POA: Diagnosis not present

## 2018-08-31 MED ORDER — PREDNISONE 20 MG PO TABS: ORAL_TABLET | ORAL | 0 refills | Status: DC

## 2018-08-31 MED ORDER — DOXYCYCLINE HYCLATE 100 MG PO CAPS: ORAL_CAPSULE | ORAL | 0 refills | Status: DC

## 2018-09-01 LAB — MAGNESIUM: Magnesium: 2 mg/dL (ref 1.5–2.5)

## 2018-09-01 LAB — URINALYSIS, ROUTINE W REFLEX MICROSCOPIC
Bilirubin Urine: NEGATIVE
Glucose, UA: NEGATIVE
Hgb urine dipstick: NEGATIVE
Ketones, ur: NEGATIVE
Leukocytes,Ua: NEGATIVE
Nitrite: NEGATIVE
Protein, ur: NEGATIVE
Specific Gravity, Urine: 1.006 (ref 1.001–1.03)
pH: 6 (ref 5.0–8.0)

## 2018-09-01 LAB — COMPLETE METABOLIC PANEL WITH GFR
AG Ratio: 2.3 (calc) (ref 1.0–2.5)
ALT: 16 U/L (ref 9–46)
AST: 20 U/L (ref 10–35)
Albumin: 4.9 g/dL (ref 3.6–5.1)
Alkaline phosphatase (APISO): 57 U/L (ref 35–144)
BUN: 15 mg/dL (ref 7–25)
CO2: 27 mmol/L (ref 20–32)
Calcium: 10.2 mg/dL (ref 8.6–10.3)
Chloride: 99 mmol/L (ref 98–110)
Creat: 0.97 mg/dL (ref 0.70–1.25)
GFR, Est African American: 92 mL/min/{1.73_m2} (ref >=60)
GFR, Est Non African American: 79 mL/min/{1.73_m2} (ref >=60)
Globulin: 2.1 g/dL (calc) (ref 1.9–3.7)
Glucose, Bld: 113 mg/dL — ABNORMAL HIGH (ref 65–99)
Potassium: 4.1 mmol/L (ref 3.5–5.3)
Sodium: 137 mmol/L (ref 135–146)
Total Bilirubin: 0.7 mg/dL (ref 0.2–1.2)
Total Protein: 7 g/dL (ref 6.1–8.1)

## 2018-09-01 LAB — LIPID PANEL
Cholesterol: 218 mg/dL — ABNORMAL HIGH (ref <200)
HDL: 57 mg/dL (ref >=40)
LDL Cholesterol (Calc): 127 mg/dL (calc) — ABNORMAL HIGH
Non-HDL Cholesterol (Calc): 161 mg/dL (calc) — ABNORMAL HIGH (ref <130)
Total CHOL/HDL Ratio: 3.8 (calc) (ref <5.0)
Triglycerides: 204 mg/dL — ABNORMAL HIGH (ref <150)

## 2018-09-01 LAB — CBC WITH DIFFERENTIAL/PLATELET
Absolute Monocytes: 698 cells/uL (ref 200–950)
Basophils Absolute: 30 cells/uL (ref 0–200)
Basophils Relative: 0.4 %
Eosinophils Absolute: 38 cells/uL (ref 15–500)
Eosinophils Relative: 0.5 %
HCT: 48.3 % (ref 38.5–50.0)
Hemoglobin: 16.4 g/dL (ref 13.2–17.1)
Lymphs Abs: 1823 cells/uL (ref 850–3900)
MCH: 31.7 pg (ref 27.0–33.0)
MCHC: 34 g/dL (ref 32.0–36.0)
MCV: 93.4 fL (ref 80.0–100.0)
MPV: 11.9 fL (ref 7.5–12.5)
Monocytes Relative: 9.3 %
Neutro Abs: 4913 cells/uL (ref 1500–7800)
Neutrophils Relative %: 65.5 %
Platelets: 184 10*3/uL (ref 140–400)
RBC: 5.17 10*6/uL (ref 4.20–5.80)
RDW: 12.7 % (ref 11.0–15.0)
Total Lymphocyte: 24.3 %
WBC: 7.5 10*3/uL (ref 3.8–10.8)

## 2018-09-01 LAB — HEMOGLOBIN A1C
Hgb A1c MFr Bld: 5.7 % of total Hgb — ABNORMAL HIGH (ref <5.7)
Mean Plasma Glucose: 117 (calc)
eAG (mmol/L): 6.5 (calc)

## 2018-09-01 LAB — PSA: PSA: 1.9 ng/mL (ref <=4.0)

## 2018-09-01 LAB — INSULIN, RANDOM: Insulin: 10.2 u[IU]/mL

## 2018-09-01 LAB — MICROALBUMIN / CREATININE URINE RATIO
Creatinine, Urine: 25 mg/dL (ref 20–320)
Microalb Creat Ratio: 8 mcg/mg creat (ref <30)
Microalb, Ur: 0.2 mg/dL

## 2018-09-01 LAB — VITAMIN D 25 HYDROXY (VIT D DEFICIENCY, FRACTURES): Vit D, 25-Hydroxy: 49 ng/mL (ref 30–100)

## 2018-09-01 LAB — TSH: TSH: 1.53 mIU/L (ref 0.40–4.50)

## 2018-09-05 ENCOUNTER — Encounter: Payer: Self-pay | Admitting: *Deleted

## 2018-09-21 ENCOUNTER — Other Ambulatory Visit: Payer: Self-pay | Admitting: Adult Health

## 2018-09-25 ENCOUNTER — Other Ambulatory Visit: Payer: Self-pay

## 2018-09-25 DIAGNOSIS — Z1212 Encounter for screening for malignant neoplasm of rectum: Principal | ICD-10-CM

## 2018-09-25 DIAGNOSIS — Z1211 Encounter for screening for malignant neoplasm of colon: Secondary | ICD-10-CM

## 2018-09-25 LAB — POC HEMOCCULT BLD/STL (HOME/3-CARD/SCREEN)
Card #2 Fecal Occult Blod, POC: NEGATIVE
Card #3 Fecal Occult Blood, POC: NEGATIVE
Fecal Occult Blood, POC: NEGATIVE

## 2018-09-26 DIAGNOSIS — Z1211 Encounter for screening for malignant neoplasm of colon: Secondary | ICD-10-CM | POA: Diagnosis not present

## 2018-12-01 ENCOUNTER — Ambulatory Visit: Payer: Medicare Other | Admitting: Adult Health

## 2018-12-05 ENCOUNTER — Ambulatory Visit: Payer: Self-pay | Admitting: Adult Health

## 2019-01-28 ENCOUNTER — Other Ambulatory Visit: Payer: Self-pay | Admitting: Internal Medicine

## 2019-02-04 DIAGNOSIS — Z23 Encounter for immunization: Secondary | ICD-10-CM | POA: Diagnosis not present

## 2019-02-28 ENCOUNTER — Encounter: Payer: Self-pay | Admitting: Gastroenterology

## 2019-03-06 ENCOUNTER — Other Ambulatory Visit: Payer: Self-pay

## 2019-03-06 ENCOUNTER — Ambulatory Visit (INDEPENDENT_AMBULATORY_CARE_PROVIDER_SITE_OTHER): Payer: Medicare Other | Admitting: Internal Medicine

## 2019-03-06 ENCOUNTER — Encounter: Payer: Self-pay | Admitting: Internal Medicine

## 2019-03-06 VITALS — BP 136/82 | HR 64 | Temp 97.2°F | Resp 16 | Ht 70.5 in | Wt 188.2 lb

## 2019-03-06 DIAGNOSIS — I10 Essential (primary) hypertension: Secondary | ICD-10-CM | POA: Diagnosis not present

## 2019-03-06 DIAGNOSIS — R7303 Prediabetes: Secondary | ICD-10-CM

## 2019-03-06 DIAGNOSIS — Z79899 Other long term (current) drug therapy: Secondary | ICD-10-CM

## 2019-03-06 DIAGNOSIS — R7309 Other abnormal glucose: Secondary | ICD-10-CM

## 2019-03-06 DIAGNOSIS — E782 Mixed hyperlipidemia: Secondary | ICD-10-CM

## 2019-03-06 DIAGNOSIS — E559 Vitamin D deficiency, unspecified: Secondary | ICD-10-CM | POA: Diagnosis not present
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
## 2019-03-06 NOTE — Patient Instructions (Signed)

## 2019-03-07 LAB — COMPLETE METABOLIC PANEL WITH GFR
AG Ratio: 2 (calc) (ref 1.0–2.5)
ALT: 15 U/L (ref 9–46)
AST: 20 U/L (ref 10–35)
Albumin: 4.8 g/dL (ref 3.6–5.1)
Alkaline phosphatase (APISO): 63 U/L (ref 35–144)
BUN: 17 mg/dL (ref 7–25)
CO2: 29 mmol/L (ref 20–32)
Calcium: 10.2 mg/dL (ref 8.6–10.3)
Chloride: 101 mmol/L (ref 98–110)
Creat: 1.03 mg/dL (ref 0.70–1.18)
GFR, Est African American: 85 mL/min/{1.73_m2} (ref >=60)
GFR, Est Non African American: 73 mL/min/{1.73_m2} (ref >=60)
Globulin: 2.4 g/dL (calc) (ref 1.9–3.7)
Glucose, Bld: 108 mg/dL — ABNORMAL HIGH (ref 65–99)
Potassium: 4.6 mmol/L (ref 3.5–5.3)
Sodium: 139 mmol/L (ref 135–146)
Total Bilirubin: 0.7 mg/dL (ref 0.2–1.2)
Total Protein: 7.2 g/dL (ref 6.1–8.1)

## 2019-03-07 LAB — HEMOGLOBIN A1C
Hgb A1c MFr Bld: 5.5 % of total Hgb (ref <5.7)
Mean Plasma Glucose: 111 (calc)
eAG (mmol/L): 6.2 (calc)

## 2019-03-07 LAB — LIPID PANEL
Cholesterol: 201 mg/dL — ABNORMAL HIGH (ref <200)
HDL: 52 mg/dL (ref >=40)
LDL Cholesterol (Calc): 103 mg/dL (calc) — ABNORMAL HIGH
Non-HDL Cholesterol (Calc): 149 mg/dL (calc) — ABNORMAL HIGH (ref <130)
Total CHOL/HDL Ratio: 3.9 (calc) (ref <5.0)
Triglycerides: 344 mg/dL — ABNORMAL HIGH (ref <150)

## 2019-03-07 LAB — CBC WITH DIFFERENTIAL/PLATELET
Absolute Monocytes: 585 cells/uL (ref 200–950)
Basophils Absolute: 41 cells/uL (ref 0–200)
Basophils Relative: 0.6 %
Eosinophils Absolute: 48 cells/uL (ref 15–500)
Eosinophils Relative: 0.7 %
HCT: 52.6 % — ABNORMAL HIGH (ref 38.5–50.0)
Hemoglobin: 17.8 g/dL — ABNORMAL HIGH (ref 13.2–17.1)
Lymphs Abs: 1952 cells/uL (ref 850–3900)
MCH: 31.7 pg (ref 27.0–33.0)
MCHC: 33.8 g/dL (ref 32.0–36.0)
MCV: 93.8 fL (ref 80.0–100.0)
MPV: 11.7 fL (ref 7.5–12.5)
Monocytes Relative: 8.6 %
Neutro Abs: 4175 cells/uL (ref 1500–7800)
Neutrophils Relative %: 61.4 %
Platelets: 198 10*3/uL (ref 140–400)
RBC: 5.61 10*6/uL (ref 4.20–5.80)
RDW: 12.9 % (ref 11.0–15.0)
Total Lymphocyte: 28.7 %
WBC: 6.8 10*3/uL (ref 3.8–10.8)

## 2019-03-07 LAB — TSH: TSH: 1.57 mIU/L (ref 0.40–4.50)

## 2019-03-07 LAB — INSULIN, RANDOM: Insulin: 12 u[IU]/mL

## 2019-03-07 LAB — MAGNESIUM: Magnesium: 2.1 mg/dL (ref 1.5–2.5)

## 2019-03-07 LAB — VITAMIN D 25 HYDROXY (VIT D DEFICIENCY, FRACTURES): Vit D, 25-Hydroxy: 34 ng/mL (ref 30–100)

## 2019-03-20 ENCOUNTER — Ambulatory Visit: Payer: Self-pay | Admitting: Physician Assistant

## 2019-03-23 ENCOUNTER — Other Ambulatory Visit: Payer: Self-pay | Admitting: Internal Medicine

## 2019-06-19 NOTE — Progress Notes (Deleted)
MEDICARE ANNUAL WELLNESS VISIT AND FOLLOW UP Assessment:    Essential hypertension - continue medications, DASH diet, exercise and monitor at home. Call if greater than 130/80.  -     CBC with Differential/Platelet -     BASIC METABOLIC PANEL WITH GFR -     Hepatic function panel  Cigarette nicotine dependence without complication Smoking cessation-  instruction/counseling given, counseled patient on the dangers of tobacco use, advised patient to stop smoking, and reviewed strategies to maximize success, patient not ready to quit at this time.   Alcohol abuse Counseled on decreasing, no hx of DTS  Prediabetes Discussed general issues about diabetes pathophysiology and management., Educational material distributed., Suggested low cholesterol diet., Encouraged aerobic exercise., Discussed foot care., Reminded to get yearly retinal exam.  Mixed hyperlipidemia -continue medications, check lipids, decrease fatty foods, increase activity.  -     Lipid panel  Medication management Monitor  Vitamin D deficiency Continue supplement  BMI 25.0-25.9,adult  Encounter for Medicare annual wellness exam 1 year   Over 30 minutes of exam, counseling, chart review, and critical decision making was performed Future Appointments  Date Time Provider [place]  06/21/2019 11:15 AM Vicie Mutters, PA-C [facility] None  10/08/2019 10:00 AM Unk Pinto, MD [facility] None    Plan:   During the course of the visit the patient was educated and counseled about appropriate screening and preventive services including:    Pneumococcal vaccine   Influenza vaccine  Prevnar 13  Td vaccine  Screening electrocardiogram  Colorectal cancer screening  Diabetes screening  Glaucoma screening  Nutrition counseling     Subjective:  Michael Fields is a 69 y.o. male who presents for Medicare Annual Wellness Visit and 3 month follow up for HTN, hyperlipidemia, prediabetes, and  vitamin D Def.   His blood pressure has been controlled at home, today their BP is   He does not workout, does yard work. He denies chest pain, shortness of breath, dizziness.    He is on cholesterol medication, crestor 40 will occ miss a day and denies myalgias. His cholesterol is not at goal. The cholesterol last visit was:   Lab Results  Component Value Date   CHOL 201 (H) 03/06/2019   HDL 52 03/06/2019   LDLCALC 103 (H) 03/06/2019   TRIG 344 (H) 03/06/2019   CHOLHDL 3.9 03/06/2019   He has been working on diet and exercise for prediabetes, and denies foot ulcerations, hyperglycemia, hypoglycemia , increased appetite, nausea, paresthesia of the feet, polydipsia, polyuria, visual disturbances, vomiting and weight loss. Last A1C in the office was:  Lab Results  Component Value Date   HGBA1C 5.5 03/06/2019   Last GFR Lab Results  Component Value Date   GFRNONAA 73 03/06/2019   Patient is on Vitamin D supplement.   Lab Results  Component Value Date   VD25OH 34 03/06/2019     BMI is There is no height or weight on file to calculate BMI., he is working on diet and exercise. Wt Readings from Last 3 Encounters:  03/06/19 188 lb 3.2 oz (85.4 kg)  08/31/18 191 lb 3.2 oz (86.7 kg)  03/03/18 187 lb 9.6 oz (85.1 kg)     Medication Review:   Current Outpatient Medications (Cardiovascular):  .  bisoprolol-hydrochlorothiazide (ZIAC) 5-6.25 MG tablet, TAKE 1 TABLET BY MOUTH EVERY DAY .  rosuvastatin (CRESTOR) 40 MG tablet, TAKE 1/2 TO 1 TABLET DAILY OR AS DIRECTED FOR CHOLESTEROL   Current Outpatient Medications (Analgesics):  .  aspirin EC 81 MG tablet, Take 81 mg by mouth daily.   Current Outpatient Medications (Other):  Marland Kitchen  Cholecalciferol (VITAMIN D PO), Take 4,000 Units by mouth 2 (two) times daily.   Current Problems (verified) Patient Active Problem List   Diagnosis Date Noted  . COPD (chronic obstructive pulmonary disease) ([place]) 03/07/2018  . Overweight (BMI  25.0-29.9) 02/05/2015  . Medication management 10/30/2014  . Mixed hyperlipidemia 06/24/2014  . HTN 05/14/2014  . Other abnormal glucose 05/14/2014  . Vitamin D deficiency 05/14/2014  . Nicotine dependence 01/17/2014  . Alcohol abuse 01/17/2014    Screening Tests Immunization History  Administered Date(s) Administered  . Influenza, High Dose Seasonal PF 12/31/2016, 02/07/2018  . Influenza-Unspecified 01/06/2016, 12/31/2016, 02/04/2019  . Pneumococcal Conjugate-13 10/01/2015, 02/04/2019  . Pneumococcal Polysaccharide-23 01/26/2017  . Tdap 01/14/2014   Preventative care: Last colonoscopy: 02/2016 CXR has been a long time, smokes x 55 years  Prior vaccinations: TD or Tdap: 08-21-13  Influenza: 08/21/17 AT [facility] Pneumococcal: 21-Aug-2016 Prevnar13: 2015-08-22 Shingles/Zostavax: Declined  Names of Other Physician/Practitioners you currently use: 1. [facility] here for primary care 2. Does not see one, eye doctor, last visit NONE 3. Does not see one, dentist, last visit NONE Patient Care Team: Unk Pinto, MD as PCP - General (Internal Medicine) Charlotte Crumb, MD as Consulting Physician (Orthopedic Surgery) Altamese Rothschild, MD as Consulting Physician (Orthopedic Surgery)  Allergies No Known Allergies  SURGICAL HISTORY He  has a past surgical history that includes Brain surgery; Hernia repair 08-22-10); ORIF wrist fracture (Right, 04/02/2013); ORIF acetabular fracture (Right, 01/15/2014); and Hip surgery. FAMILY HISTORY His family history is not on file.  States does not know FM history, never went to doctors- 2 brother dead age 2 and 37 and one brother 17, and has 3 sisters, 1 died 2017/08/21.  SOCIAL HISTORY He  reports that he has been smoking cigarettes. He has been smoking about 1.00 pack per day. He has never used smokeless tobacco. He reports current alcohol use of about 24.0 standard drinks of alcohol per week. He reports that he does not use  drugs. 6-10 beers, will vary.   MEDICARE WELLNESS OBJECTIVES: Physical activity:   Cardiac risk factors:   Depression/mood screen:   Depression screen [facility] 2/9 03/06/2019  Decreased Interest 0  Down, Depressed, Hopeless 0  PHQ - 2 Score 0    ADLs:  In your present state of health, do you have any difficulty performing the following activities: 03/06/2019 08/30/2018  Hearing? N N  Vision? N N  Difficulty concentrating or making decisions? N N  Walking or climbing stairs? N N  Dressing or bathing? N N  Doing errands, shopping? N N  Some recent data might be hidden     Cognitive Testing  Alert? Yes  Normal Appearance?Yes  Oriented to person? Yes  Place? Yes   Time? Yes  Recall of three objects?  Yes  Can perform simple calculations? Yes  Displays appropriate judgment?Yes  Can read the correct time from a watch face?Yes  EOL planning:     Objective:   There were no vitals filed for this visit. There is no height or weight on file to calculate BMI.  General appearance: alert, no distress, WD/WN, male HEENT: normocephalic, sclerae anicteric, TMs pearly, nares patent, no discharge or erythema, pharynx normal Oral cavity: no lesions, upper dentures, poor dentition Neck: supple, no lymphadenopathy, no thyromegaly, no masses Heart: RRR, normal S1, S2, no murmurs Lungs: CTA bilaterally, no wheezes,  rhonchi, or rales Abdomen: +bs, soft, non tender, non distended, no masses, no hepatomegaly, no splenomegaly Musculoskeletal: nontender, no swelling, no obvious deformity Extremities: no edema, no cyanosis, no clubbing Pulses: 2+ symmetric, upper and lower extremities, normal cap refill Neurological: alert, oriented x 3, CN2-12 intact, strength normal upper extremities and lower extremities, sensation normal throughout, DTRs 2+ throughout, no cerebellar signs, gait normal Psychiatric: normal affect, behavior normal, pleasant   Medicare Attestation I have personally reviewed: The  patient's medical and social history Their use of alcohol, tobacco or illicit drugs Their current medications and supplements The patient's functional ability including ADLs,fall risks, home safety risks, cognitive, and hearing and visual impairment Diet and physical activities Evidence for depression or mood disorders  The patient's weight, height, BMI, and visual acuity have been recorded in the chart.  I have made referrals, counseling, and provided education to the patient based on review of the above and I have provided the patient with a written personalized care plan for preventive services.     Vicie Mutters, PA-C   06/19/2019

## 2019-06-21 ENCOUNTER — Other Ambulatory Visit: Payer: Self-pay

## 2019-06-21 ENCOUNTER — Ambulatory Visit: Payer: Medicare Other | Admitting: Physician Assistant

## 2019-07-27 ENCOUNTER — Other Ambulatory Visit: Payer: Self-pay | Admitting: Adult Health

## 2019-09-21 ENCOUNTER — Other Ambulatory Visit: Payer: Self-pay | Admitting: Physician Assistant

## 2019-10-03 DIAGNOSIS — Z23 Encounter for immunization: Secondary | ICD-10-CM | POA: Diagnosis not present

## 2019-10-08 ENCOUNTER — Encounter: Payer: Medicare Other | Admitting: Internal Medicine

## 2019-10-08 ENCOUNTER — Encounter: Payer: Medicare Other | Admitting: Adult Health Nurse Practitioner

## 2019-10-08 NOTE — Progress Notes (Deleted)
MEDICARE ANNUAL WELLNESS VISIT AND FOLLOW UP Assessment:    Essential hypertension - continue medications, DASH diet, exercise and monitor at home. Call if greater than 130/80.  -     CBC with Differential/Platelet -     BASIC METABOLIC PANEL WITH GFR -     Hepatic function panel  Cigarette nicotine dependence without complication Smoking cessation-  instruction/counseling given, counseled patient on the dangers of tobacco use, advised patient to stop smoking, and reviewed strategies to maximize success, patient not ready to quit at this time.   Alcohol abuse Counseled on decreasing, no hx of DTS  Prediabetes Discussed general issues about diabetes pathophysiology and management., Educational material distributed., Suggested low cholesterol diet., Encouraged aerobic exercise., Discussed foot care., Reminded to get yearly retinal exam.  Mixed hyperlipidemia -continue medications, check lipids, decrease fatty foods, increase activity.  -     Lipid panel  Medication management Monitor  Vitamin D deficiency Continue supplement  BMI 25.0-25.9,adult  Encounter for Medicare annual wellness exam 1 year   Over 30 minutes of exam, counseling, chart review, and critical decision making was performed Future Appointments  Date Time Provider [place]  10/08/2019 10:00 AM Garnet Sierras, NP [facility] None  10/07/2020 11:00 AM Unk Pinto, MD [facility] None    Plan:   During the course of the visit the patient was educated and counseled about appropriate screening and preventive services including:    Pneumococcal vaccine   Influenza vaccine  Prevnar 13  Td vaccine  Screening electrocardiogram  Colorectal cancer screening  Diabetes screening  Glaucoma screening  Nutrition counseling     Subjective:  Michael Fields is a 69 y.o. male who presents for Medicare Annual Wellness Visit and 3 month follow up for HTN, hyperlipidemia, prediabetes, and vitamin  D Def.   His blood pressure has been controlled at home, today their BP is   He does not workout, does yard work. He denies chest pain, shortness of breath, dizziness.    He is on cholesterol medication, crestor 40 will occ miss a day and denies myalgias. His cholesterol is not at goal. The cholesterol last visit was:   Lab Results  Component Value Date   CHOL 201 (H) 03/06/2019   HDL 52 03/06/2019   LDLCALC 103 (H) 03/06/2019   TRIG 344 (H) 03/06/2019   CHOLHDL 3.9 03/06/2019   He has been working on diet and exercise for prediabetes, and denies foot ulcerations, hyperglycemia, hypoglycemia , increased appetite, nausea, paresthesia of the feet, polydipsia, polyuria, visual disturbances, vomiting and weight loss. Last A1C in the office was:  Lab Results  Component Value Date   HGBA1C 5.5 03/06/2019   Last GFR Lab Results  Component Value Date   GFRNONAA 73 03/06/2019   Patient is on Vitamin D supplement.   Lab Results  Component Value Date   VD25OH 34 03/06/2019     BMI is There is no height or weight on file to calculate BMI., he is working on diet and exercise. Wt Readings from Last 3 Encounters:  03/06/19 188 lb 3.2 oz (85.4 kg)  08/31/18 191 lb 3.2 oz (86.7 kg)  03/03/18 187 lb 9.6 oz (85.1 kg)     Medication Review: Current Outpatient Medications on File Prior to Visit  Medication Sig Dispense Refill  . aspirin EC 81 MG tablet Take 81 mg by mouth daily.    . bisoprolol-hydrochlorothiazide (ZIAC) 5-6.25 MG tablet Take 1 tablet Daily for BP 90 tablet 0  . Cholecalciferol (VITAMIN  D PO) Take 4,000 Units by mouth 2 (two) times daily.     . rosuvastatin (CRESTOR) 40 MG tablet TAKE 1/2 TO 1 TABLET DAILY OR AS DIRECTED FOR CHOLESTEROL 90 tablet 1   No current facility-administered medications on file prior to visit.    Current Problems (verified) Patient Active Problem List   Diagnosis Date Noted  . COPD (chronic obstructive pulmonary disease) ([place]) 03/07/2018  .  Overweight (BMI 25.0-29.9) 02/05/2015  . Medication management 10/30/2014  . Mixed hyperlipidemia 06/24/2014  . HTN 05/14/2014  . Other abnormal glucose 05/14/2014  . Vitamin D deficiency 05/14/2014  . Nicotine dependence 01/17/2014  . Alcohol abuse 01/17/2014    Screening Tests Immunization History  Administered Date(s) Administered  . Influenza, High Dose Seasonal PF 12/31/2016, 02/07/2018, 02/04/2019  . Influenza-Unspecified 01/06/2016, 12/31/2016, 02/04/2019  . Pneumococcal Conjugate-13 10/01/2015, 02/04/2019  . Pneumococcal Polysaccharide-23 01/26/2017  . Tdap 01/14/2014   Preventative care: Last colonoscopy: 02/2016 CXR has been a long time, smokes x 55 years  Prior vaccinations: TD or Tdap: 09-09-2013  Influenza:Due for 09-10-2018 Pneumococcal: 09/09/2016 Prevnar13: 09/10/15 Shingles/Zostavax: Declined, discussed with patient  Names of Other Physician/Practitioners you currently use: 1. [facility] here for primary care 2. Does not see one, eye doctor, last visit NONE 3. Does not see one, dentist, last visit NONE Patient Care Team: Unk Pinto, MD as PCP - General (Internal Medicine) Charlotte Crumb, MD as Consulting Physician (Orthopedic Surgery) Altamese [place], MD as Consulting Physician (Orthopedic Surgery)  Allergies No Known Allergies  SURGICAL HISTORY He  has a past surgical history that includes Brain surgery; Hernia repair 2010-09-10); ORIF wrist fracture (Right, 04/02/2013); ORIF acetabular fracture (Right, 01/15/2014); and Hip surgery. FAMILY HISTORY His family history is not on file.  States does not know FM history, never went to doctors- 2 brother dead age 62 and 76 and one brother 38, and has 3 sisters, 1 died 09-Sep-2017.  SOCIAL HISTORY He  reports that he has been smoking cigarettes. He has been smoking about 1.00 pack per day. He has never used smokeless tobacco. He reports current alcohol use of about 24.0 standard drinks of alcohol  per week. He reports that he does not use drugs. 6-10 beers, will vary.   MEDICARE WELLNESS OBJECTIVES: Physical activity:   Cardiac risk factors:   Depression/mood screen:   Depression screen [facility] 2/9 03/06/2019  Decreased Interest 0  Down, Depressed, Hopeless 0  PHQ - 2 Score 0    ADLs:  In your present state of health, do you have any difficulty performing the following activities: 03/06/2019  Hearing? N  Vision? N  Difficulty concentrating or making decisions? N  Walking or climbing stairs? N  Dressing or bathing? N  Doing errands, shopping? N  Some recent data might be hidden     Cognitive Testing  Alert? Yes  Normal Appearance?Yes  Oriented to person? Yes  Place? Yes   Time? Yes  Recall of three objects?  Yes  Can perform simple calculations? Yes  Displays appropriate judgment?Yes  Can read the correct time from a watch face?Yes  EOL planning:     Objective:   There were no vitals filed for this visit. There is no height or weight on file to calculate BMI.  General appearance: alert, no distress, WD/WN, male HEENT: normocephalic, sclerae anicteric, TMs pearly, nares patent, no discharge or erythema, pharynx normal Oral cavity: no lesions, upper dentures, poor dentition Neck: supple, no lymphadenopathy, no thyromegaly, no masses Heart: RRR,  normal S1, S2, no murmurs Lungs: CTA bilaterally, no wheezes, rhonchi, or rales Abdomen: +bs, soft, non tender, non distended, no masses, no hepatomegaly, no splenomegaly Musculoskeletal: nontender, no swelling, no obvious deformity Extremities: no edema, no cyanosis, no clubbing Pulses: 2+ symmetric, upper and lower extremities, normal cap refill Neurological: alert, oriented x 3, CN2-12 intact, strength normal upper extremities and lower extremities, sensation normal throughout, DTRs 2+ throughout, no cerebellar signs, gait normal Psychiatric: normal affect, behavior normal, pleasant   Medicare Attestation I have  personally reviewed: The patient's medical and social history Their use of alcohol, tobacco or illicit drugs Their current medications and supplements The patient's functional ability including ADLs,fall risks, home safety risks, cognitive, and hearing and visual impairment Diet and physical activities Evidence for depression or mood disorders  The patient's weight, height, BMI, and visual acuity have been recorded in the chart.  I have made referrals, counseling, and provided education to the patient based on review of the above and I have provided the patient with a written personalized care plan for preventive services.     Garnet Sierras, NP   10/08/2019

## 2019-12-18 ENCOUNTER — Other Ambulatory Visit: Payer: Self-pay | Admitting: Internal Medicine

## 2019-12-19 ENCOUNTER — Encounter: Payer: Self-pay | Admitting: Adult Health

## 2019-12-19 ENCOUNTER — Ambulatory Visit (INDEPENDENT_AMBULATORY_CARE_PROVIDER_SITE_OTHER): Payer: Medicare Other | Admitting: Adult Health

## 2019-12-19 ENCOUNTER — Other Ambulatory Visit: Payer: Self-pay

## 2019-12-19 VITALS — BP 146/68 | HR 58 | Temp 97.5°F | Wt 184.8 lb

## 2019-12-19 DIAGNOSIS — E663 Overweight: Secondary | ICD-10-CM

## 2019-12-19 DIAGNOSIS — J449 Chronic obstructive pulmonary disease, unspecified: Secondary | ICD-10-CM

## 2019-12-19 DIAGNOSIS — Z79899 Other long term (current) drug therapy: Secondary | ICD-10-CM | POA: Diagnosis not present

## 2019-12-19 DIAGNOSIS — R7309 Other abnormal glucose: Secondary | ICD-10-CM

## 2019-12-19 DIAGNOSIS — F17210 Nicotine dependence, cigarettes, uncomplicated: Secondary | ICD-10-CM

## 2019-12-19 DIAGNOSIS — E559 Vitamin D deficiency, unspecified: Secondary | ICD-10-CM | POA: Diagnosis not present

## 2019-12-19 DIAGNOSIS — E782 Mixed hyperlipidemia: Secondary | ICD-10-CM | POA: Diagnosis not present

## 2019-12-19 DIAGNOSIS — I10 Essential (primary) hypertension: Secondary | ICD-10-CM | POA: Diagnosis not present

## 2019-12-19 MED ORDER — BISOPROLOL-HYDROCHLOROTHIAZIDE 10-6.25 MG PO TABS: 1.0000 | ORAL_TABLET | Freq: Every day | ORAL | 1 refills | Status: DC

## 2019-12-19 MED ORDER — ROSUVASTATIN CALCIUM 40 MG PO TABS: ORAL_TABLET | ORAL | 1 refills | Status: DC

## 2019-12-19 NOTE — Progress Notes (Signed)
FOLLOW UP  Assessment and Plan:   Hypertension Poorly controlled; increasing ziac to 10-6.25  Monitor blood pressure at home; patient to call if consistently greater than 130/80 Continue DASH diet.   Reminder to go to the ER if any CP, SOB, nausea, dizziness, severe HA, changes vision/speech, left arm numbness and tingling and jaw pain.  Cholesterol Currently above goal; taking rosuvastatin 40 mg daily  Consider zetia Continue low cholesterol diet and exercise.  Check lipid panel.   Other abnormal gluocse Recent A1Cs at goal Discussed diet/exercise, weight management  Defer A1C; check BMP  Overweight Long discussion about weight loss, diet, and exercise Recommended diet heavy in fruits and veggies and low in animal meats, cheeses, and dairy products, appropriate calorie intake Discussed ideal weight for height  Will follow up in 3 months  Vitamin D Def Below goal at last visit;  continue supplementation for goal of 70-100 Check Vit D level  Tobacco use Discussed risks associated with tobacco use and advised to reduce or quit Discussed chantix, wellbutrin, patient declines at this time Advised to consider strongly Will follow up at the next visit  Continue diet and meds as discussed. Further disposition pending results of labs. Discussed med's effects and SE's.   Over 30 minutes of exam, counseling, chart review, and critical decision making was performed.   No future appointments.  ----------------------------------------------------------------------------------------------------------------------  HPI 69 y.o. male  presents for 3 month follow up on hypertension, cholesterol, glucose management, weight and vitamin D deficiency.   he currently continues to smoke 0.5--1 pack a day; discussed risks associated with smoking, patient is not ready to quit.   BMI is Body mass index is 26.14 kg/m., he has been working on diet and exercise, watches portions, some walking  occasionally.  Wt Readings from Last 3 Encounters:  12/19/19 184 lb 12.8 oz (83.8 kg)  03/06/19 188 lb 3.2 oz (85.4 kg)  08/31/18 191 lb 3.2 oz (86.7 kg)   He does not check BP at home, today their BP is BP: (!) 146/68 Similar by manual recheck by provider. He currently takes ziac 5-6.25 dailly.   He does workout. He denies chest pain, shortness of breath, dizziness.   He is on cholesterol medication (crestor 40 mg daily) and denies myalgias. His cholesterol is not at goal. The cholesterol last visit was:   Lab Results  Component Value Date   CHOL 201 (H) 03/06/2019   HDL 52 03/06/2019   LDLCALC 103 (H) 03/06/2019   TRIG 344 (H) 03/06/2019   CHOLHDL 3.9 03/06/2019    He has been working on diet and exercise for glucose management, and denies foot ulcerations, increased appetite, nausea, paresthesia of the feet, polydipsia, polyuria, visual disturbances, vomiting and weight loss. Last A1C in the office was:  Lab Results  Component Value Date   HGBA1C 5.5 03/06/2019   Patient is on Vitamin D supplement, taking 5000 IU most days   Lab Results  Component Value Date   VD25OH 34 03/06/2019        Current Medications:  Current Outpatient Medications on File Prior to Visit  Medication Sig  . aspirin (ASPIRIN ADULT) 325 MG tablet Take 325 mg by mouth as needed.  . bisoprolol-hydrochlorothiazide (ZIAC) 5-6.25 MG tablet Take 1 tablet Daily for BP  . Cholecalciferol (VITAMIN D PO) Take 4,000 Units by mouth 2 (two) times daily.   . rosuvastatin (CRESTOR) 40 MG tablet TAKE 1/2 TO 1 TABLET DAILY OR AS DIRECTED FOR CHOLESTEROL  . aspirin EC  81 MG tablet Take 81 mg by mouth daily.   No current facility-administered medications on file prior to visit.     Allergies: No Known Allergies   Medical History:  Past Medical History:  Diagnosis Date  . Hemorrhoids   . Hypertension   . Nicotine dependence    Family history- Reviewed and unchanged Social history- Reviewed and  unchanged   Review of Systems:  Review of Systems  Constitutional: Negative for malaise/fatigue and weight loss.  HENT: Negative for hearing loss and tinnitus.   Eyes: Negative for blurred vision and double vision.  Respiratory: Negative for cough, shortness of breath and wheezing.   Cardiovascular: Negative for chest pain, palpitations, orthopnea, claudication and leg swelling.  Gastrointestinal: Negative for abdominal pain, blood in stool, constipation, diarrhea, heartburn, melena, nausea and vomiting.  Genitourinary: Negative.   Musculoskeletal: Negative for joint pain and myalgias.  Skin: Negative for rash.  Neurological: Negative for dizziness, tingling, sensory change, weakness and headaches.  Endo/Heme/Allergies: Negative for polydipsia.  Psychiatric/Behavioral: Positive for substance abuse (Excessive ETOH).  All other systems reviewed and are negative.   Physical Exam: BP (!) 146/68   Pulse 58   Temp (!) 97.5 F (36.4 C)   Wt 184 lb 12.8 oz (83.8 kg)   SpO2 98%   BMI 26.14 kg/m  Wt Readings from Last 3 Encounters:  12/19/19 184 lb 12.8 oz (83.8 kg)  03/06/19 188 lb 3.2 oz (85.4 kg)  08/31/18 191 lb 3.2 oz (86.7 kg)   General Appearance: Well nourished, in no apparent distress. Eyes: PERRLA, EOMs, conjunctiva no swelling or erythema Sinuses: No Frontal/maxillary tenderness ENT/Mouth: Ext aud canals clear, TMs without erythema, bulging. No erythema, swelling, or exudate on post pharynx.  Tonsils not swollen or erythematous. Hearing normal.  Neck: Supple, thyroid normal.  Respiratory: Respiratory effort normal, BS equal bilaterally without rales, rhonchi, wheezing or stridor.  Cardio: RRR with no MRGs. Brisk peripheral pulses without edema.  Abdomen: Soft, + BS.  Non tender, no guarding, rebound, hernias, masses. Lymphatics: Non tender without lymphadenopathy.  Musculoskeletal: Full ROM, 5/5 strength, Normal gait Skin: Warm, dry without rashes, lesions, ecchymosis.   Neuro: Cranial nerves intact. No cerebellar symptoms.  Psych: Awake and oriented X 3, normal affect, Insight and Judgment appropriate.    Izora Ribas, NP 11:02 AM [facility]

## 2019-12-19 NOTE — Patient Instructions (Addendum)
Goals    . Blood Pressure < 130/80    . Exercise 150 min/wk Moderate Activity         HYPERTENSION INFORMATION  Monitor your blood pressure at home, please keep a record and bring that in with you to your next office visit.   Go to the ER if any CP, SOB, nausea, dizziness, severe HA, changes vision/speech  Testing/Procedures: HOW TO TAKE YOUR BLOOD PRESSURE:  Rest 5 minutes before taking your blood pressure.  Don't smoke or drink caffeinated beverages for at least 30 minutes before.  Take your blood pressure before (not after) you eat.  Sit comfortably with your back supported and both feet on the floor (don't cross your legs).  Elevate your arm to heart level on a table or a desk.  Use the proper sized cuff. It should fit smoothly and snugly around your bare upper arm. There should be enough room to slip a fingertip under the cuff. The bottom edge of the cuff should be 1 inch above the crease of the elbow.  Due to a recent study, SPRINT, we have changed our goal for the systolic or top blood pressure number. Ideally we want your top number at 120.  In the [facility] Trial, 5000 people were randomized to a goal BP of 120 and 5000 people were randomized to a goal BP of less than 140. The patients with the goal BP at 120 had LESS DEMENTIA, LESS HEART ATTACKS, AND LESS STROKES, AS WELL AS OVERALL DECREASED MORTALITY OR DEATH RATE.   There was another study that showed taking your blood pressure medications at night decrease cardiovascular events.  However if you are on a fluid pill, please take this in the morning.   If you are willing, our goal BP is the top number of 120.  Your most recent BP: BP: (!) 146/68   Take your medications faithfully as instructed. Maintain a healthy weight. Get at least 150 minutes of aerobic exercise per week. Minimize salt intake. Minimize alcohol intake  DASH Eating Plan DASH stands for "Dietary Approaches to Stop Hypertension." The DASH eating  plan is a healthy eating plan that has been shown to reduce high blood pressure (hypertension). Additional health benefits may include reducing the risk of type 2 diabetes mellitus, heart disease, and stroke. The DASH eating plan may also help with weight loss. WHAT DO I NEED TO KNOW ABOUT THE DASH EATING PLAN? For the DASH eating plan, you will follow these general guidelines:  Choose foods with a percent daily value for sodium of less than 5% (as listed on the food label).  Use salt-free seasonings or herbs instead of table salt or sea salt.  Check with your health care provider or pharmacist before using salt substitutes.  Eat lower-sodium products, often labeled as "lower sodium" or "no salt added."  Eat fresh foods.  Eat more vegetables, fruits, and low-fat dairy products.  Choose whole grains. Look for the word "whole" as the first word in the ingredient list.  Choose fish and skinless chicken or [place] more often than red meat. Limit fish, poultry, and meat to 6 oz (170 g) each day.  Limit sweets, desserts, sugars, and sugary drinks.  Choose heart-healthy fats.  Limit cheese to 1 oz (28 g) per day.  Eat more home-cooked food and less restaurant, buffet, and fast food.  Limit fried foods.  Cook foods using methods other than frying.  Limit canned vegetables. If you do use them, rinse them well  to decrease the sodium.  When eating at a restaurant, ask that your food be prepared with less salt, or no salt if possible. WHAT FOODS CAN I EAT? Seek help from a dietitian for individual calorie needs. Grains Whole grain or whole wheat bread. Brown rice. Whole grain or whole wheat pasta. Quinoa, bulgur, and whole grain cereals. Low-sodium cereals. Corn or whole wheat flour tortillas. Whole grain cornbread. Whole grain crackers. Low-sodium crackers. Vegetables Fresh or frozen vegetables (raw, steamed, roasted, or grilled). Low-sodium or reduced-sodium tomato and vegetable juices.  Low-sodium or reduced-sodium tomato sauce and paste. Low-sodium or reduced-sodium canned vegetables.  Fruits All fresh, canned (in natural juice), or frozen fruits. Meat and Other Protein Products Ground beef (85% or leaner), grass-fed beef, or beef trimmed of fat. Skinless chicken or [place]. Ground chicken or [place]. Pork trimmed of fat. All fish and seafood. Eggs. Dried beans, peas, or lentils. Unsalted nuts and seeds. Unsalted canned beans. Dairy Low-fat dairy products, such as skim or 1% milk, 2% or reduced-fat cheeses, low-fat ricotta or cottage cheese, or plain low-fat yogurt. Low-sodium or reduced-sodium cheeses. Fats and Oils Tub margarines without trans fats. Light or reduced-fat mayonnaise and salad dressings (reduced sodium). Avocado. Safflower, olive, or canola oils. Natural peanut or almond butter. Other Unsalted popcorn and pretzels. The items listed above may not be a complete list of recommended foods or beverages. Contact your dietitian for more options. WHAT FOODS ARE NOT RECOMMENDED? Grains White bread. White pasta. White rice. Refined cornbread. Bagels and croissants. Crackers that contain trans fat. Vegetables Creamed or fried vegetables. Vegetables in a cheese sauce. Regular canned vegetables. Regular canned tomato sauce and paste. Regular tomato and vegetable juices. Fruits Dried fruits. Canned fruit in light or heavy syrup. Fruit juice. Meat and Other Protein Products Fatty cuts of meat. Ribs, chicken wings, bacon, sausage, bologna, salami, chitterlings, fatback, hot dogs, bratwurst, and packaged luncheon meats. Salted nuts and seeds. Canned beans with salt. Dairy Whole or 2% milk, cream, half-and-half, and cream cheese. Whole-fat or sweetened yogurt. Full-fat cheeses or blue cheese. Nondairy creamers and whipped toppings. Processed cheese, cheese spreads, or cheese curds. Condiments Onion and garlic salt, seasoned salt, table salt, and sea salt. Canned and packaged  gravies. Worcestershire sauce. Tartar sauce. Barbecue sauce. Teriyaki sauce. Soy sauce, including reduced sodium. Steak sauce. Fish sauce. Oyster sauce. Cocktail sauce. Horseradish. Ketchup and mustard. Meat flavorings and tenderizers. Bouillon cubes. Hot sauce. Tabasco sauce. Marinades. Taco seasonings. Relishes. Fats and Oils Butter, stick margarine, lard, shortening, ghee, and bacon fat. Coconut, palm kernel, or palm oils. Regular salad dressings. Other Pickles and olives. Salted popcorn and pretzels. The items listed above may not be a complete list of foods and beverages to avoid. Contact your dietitian for more information. WHERE CAN I FIND MORE INFORMATION? National Heart, Lung, and Blood Institute: travelstabloid.com Document Released: 04/29/2011 Document Revised: 09/24/2013 Document Reviewed: 03/14/2013 [facility] Patient Information 2015 [place], [place]. This information is not intended to replace advice given to you by your health care provider. Make sure you discuss any questions you have with your health care provider.

## 2019-12-20 ENCOUNTER — Other Ambulatory Visit: Payer: Self-pay | Admitting: Adult Health

## 2019-12-20 LAB — CBC WITH DIFFERENTIAL/PLATELET
Absolute Monocytes: 536 cells/uL (ref 200–950)
Basophils Absolute: 32 cells/uL (ref 0–200)
Basophils Relative: 0.5 %
Eosinophils Absolute: 32 cells/uL (ref 15–500)
Eosinophils Relative: 0.5 %
HCT: 44 % (ref 38.5–50.0)
Hemoglobin: 15 g/dL (ref 13.2–17.1)
Lymphs Abs: 1575 cells/uL (ref 850–3900)
MCH: 32.1 pg (ref 27.0–33.0)
MCHC: 34.1 g/dL (ref 32.0–36.0)
MCV: 94.2 fL (ref 80.0–100.0)
MPV: 11.6 fL (ref 7.5–12.5)
Monocytes Relative: 8.5 %
Neutro Abs: 4127 cells/uL (ref 1500–7800)
Neutrophils Relative %: 65.5 %
Platelets: 175 10*3/uL (ref 140–400)
RBC: 4.67 10*6/uL (ref 4.20–5.80)
RDW: 12.5 % (ref 11.0–15.0)
Total Lymphocyte: 25 %
WBC: 6.3 10*3/uL (ref 3.8–10.8)

## 2019-12-20 LAB — COMPLETE METABOLIC PANEL WITH GFR
AG Ratio: 2 (calc) (ref 1.0–2.5)
ALT: 22 U/L (ref 9–46)
AST: 31 U/L (ref 10–35)
Albumin: 4.5 g/dL (ref 3.6–5.1)
Alkaline phosphatase (APISO): 58 U/L (ref 35–144)
BUN: 7 mg/dL (ref 7–25)
CO2: 28 mmol/L (ref 20–32)
Calcium: 9.7 mg/dL (ref 8.6–10.3)
Chloride: 101 mmol/L (ref 98–110)
Creat: 0.95 mg/dL (ref 0.70–1.18)
GFR, Est African American: 94 mL/min/{1.73_m2} (ref >=60)
GFR, Est Non African American: 81 mL/min/{1.73_m2} (ref >=60)
Globulin: 2.2 g/dL (calc) (ref 1.9–3.7)
Glucose, Bld: 126 mg/dL — ABNORMAL HIGH (ref 65–99)
Potassium: 4.2 mmol/L (ref 3.5–5.3)
Sodium: 136 mmol/L (ref 135–146)
Total Bilirubin: 0.6 mg/dL (ref 0.2–1.2)
Total Protein: 6.7 g/dL (ref 6.1–8.1)

## 2019-12-20 LAB — LIPID PANEL
Cholesterol: 123 mg/dL (ref <200)
HDL: 62 mg/dL (ref >=40)
LDL Cholesterol (Calc): 42 mg/dL (calc)
Non-HDL Cholesterol (Calc): 61 mg/dL (calc) (ref <130)
Total CHOL/HDL Ratio: 2 (calc) (ref <5.0)
Triglycerides: 106 mg/dL (ref <150)

## 2019-12-20 LAB — VITAMIN D 25 HYDROXY (VIT D DEFICIENCY, FRACTURES): Vit D, 25-Hydroxy: 31 ng/mL (ref 30–100)

## 2019-12-20 LAB — TSH: TSH: 1.15 mIU/L (ref 0.40–4.50)

## 2019-12-20 LAB — MAGNESIUM: Magnesium: 2.3 mg/dL (ref 1.5–2.5)

## 2019-12-20 MED ORDER — VITAMIN D 125 MCG (5000 UT) PO CAPS: 10000.0000 [IU] | ORAL_CAPSULE | Freq: Every day | ORAL | Status: AC

## 2020-03-25 DIAGNOSIS — Z23 Encounter for immunization: Secondary | ICD-10-CM | POA: Diagnosis not present

## 2020-03-31 NOTE — Progress Notes (Deleted)
MEDICARE ANNUAL WELLNESS VISIT AND FOLLOW UP Assessment:   Encounter for Medicare annual wellness exam 1 year   Essential hypertension - continue medications, DASH diet, exercise and monitor at home. Call if greater than 130/80.  -     CBC with Differential/Platelet -     CMP/GFR  Cigarette nicotine dependence without complication Smoking cessation-  instruction/counseling given, counseled patient on the dangers of tobacco use, advised patient to stop smoking, and reviewed strategies to maximize success, patient not ready to quit at this time.   Alcohol abuse Counseled on decreasing, no hx of DTS  Other abnormal glucose Recent A1Cs at goal Discussed diet/exercise, weight management  Defer A1C; check CMP  Mixed hyperlipidemia -continue medications, check lipids, decrease fatty foods, increase activity.  -     Lipid panel  Medication management Monitor  Vitamin D deficiency Continue supplement  BMI 25.0-25.9,adult    Over 30 minutes of exam, counseling, chart review, and critical decision making was performed Future Appointments  Date Time Provider [place]  04/01/2020 10:00 AM Liane Comber, NP [facility] None    Plan:   During the course of the visit the patient was educated and counseled about appropriate screening and preventive services including:    Pneumococcal vaccine   Influenza vaccine  Prevnar 13  Td vaccine  Screening electrocardiogram  Colorectal cancer screening  Diabetes screening  Glaucoma screening  Nutrition counseling     Subjective:  Michael Fields is a 69 y.o. male who presents for Medicare Annual Wellness Visit and 3 month follow up for HTN, hyperlipidemia, prediabetes, and vitamin D Def.   Smoker, *** hasn't had CT lung  Alcohol abuse *** 8-12 beers a day ***  BMI is There is no height or weight on file to calculate BMI., he {HAS HAS [id number]} been working on diet and exercise. Wt Readings from Last 3  Encounters:  12/19/19 184 lb 12.8 oz (83.8 kg)  03/06/19 188 lb 3.2 oz (85.4 kg)  08/31/18 191 lb 3.2 oz (86.7 kg)   His blood pressure has been controlled at home, today their BP is   He does not workout, does yard work. He denies chest pain, shortness of breath, dizziness.    He is on cholesterol medication, crestor 40 will occ miss a day and denies myalgias. His cholesterol is not at goal. The cholesterol last visit was:   Lab Results  Component Value Date   CHOL 123 12/19/2019   HDL 62 12/19/2019   LDLCALC 42 12/19/2019   TRIG 106 12/19/2019   CHOLHDL 2.0 12/19/2019   He has been working on diet and exercise for glucose managment, and denies foot ulcerations, hyperglycemia, hypoglycemia , increased appetite, nausea, paresthesia of the feet, polydipsia, polyuria, visual disturbances, vomiting and weight loss. Last A1C in the office was:  Lab Results  Component Value Date   HGBA1C 5.5 03/06/2019   Last GFR Lab Results  Component Value Date   GFRNONAA 81 12/19/2019   Patient is on Vitamin D supplement.   Lab Results  Component Value Date   VD25OH 31 12/19/2019       Medication Review: Current Outpatient Medications on File Prior to Visit  Medication Sig Dispense Refill  . aspirin (ASPIRIN ADULT) 325 MG tablet Take 325 mg by mouth as needed.    . bisoprolol-hydrochlorothiazide (ZIAC) 10-6.25 MG tablet Take 1 tablet by mouth daily. 90 tablet 1  . Cholecalciferol (VITAMIN D) 125 MCG (5000 UT) CAPS Take 10,000 Units by mouth daily.    Marland Kitchen  rosuvastatin (CRESTOR) 40 MG tablet Take 1 tab daily for cholesterol. 90 tablet 1   No current facility-administered medications on file prior to visit.    Current Problems (verified) Patient Active Problem List   Diagnosis Date Noted  . COPD (chronic obstructive pulmonary disease) ([place]) 03/07/2018  . Overweight (BMI 25.0-29.9) 02/05/2015  . Medication management 10/30/2014  . Mixed hyperlipidemia 06/24/2014  . HTN 05/14/2014  . Other  abnormal glucose 05/14/2014  . Vitamin D deficiency 05/14/2014  . Nicotine dependence 01/17/2014  . Alcohol abuse 01/17/2014    Screening Tests Immunization History  Administered Date(s) Administered  . Influenza, High Dose Seasonal PF 12/31/2016, 02/07/2018, 02/04/2019  . Influenza-Unspecified 01/06/2016, 12/31/2016, 02/04/2019  . Moderna SARS-COVID-2 Vaccination 10/03/2019, 10/28/2019  . Pneumococcal Conjugate-13 10/01/2015, 02/04/2019  . Pneumococcal Polysaccharide-23 01/26/2017  . Tdap 01/14/2014   Preventative care: Last colonoscopy: 02/2016 CXR 08/20/2017  Prior vaccinations: TD or Tdap: August 20, 2013  Influenza: 08-21-18 Pneumococcal: 08-20-16 Prevnar13: 08/21/2015 Shingles/Zostavax: Declined Covid 19: 2/2, Aug 21, 2019, moderna  Names of Other Physician/Practitioners you currently use: 1. [facility] here for primary care 2. Does not see one, eye doctor, last visit NONE 3. Does not see one, dentist, last visit NONE  Patient Care Team: Unk Pinto, MD as PCP - General (Internal Medicine) Charlotte Crumb, MD as Consulting Physician (Orthopedic Surgery) Altamese Evergreen, MD as Consulting Physician (Orthopedic Surgery)  Allergies No Known Allergies  SURGICAL HISTORY He  has a past surgical history that includes Brain surgery; Hernia repair 2010-08-21); ORIF wrist fracture (Right, 04/02/2013); ORIF acetabular fracture (Right, 01/15/2014); and Hip surgery. FAMILY HISTORY His family history is not on file.  States does not know FM history, never went to doctors- 2 brother dead age 24 and 42 and one brother 68, and has 3 sisters, 1 died 08-20-2017.   SOCIAL HISTORY He  reports that he has been smoking cigarettes. He started smoking about 57 years ago. He has a 34.20 pack-year smoking history. He has never used smokeless tobacco. He reports current alcohol use of about 24.0 standard drinks of alcohol per week. He reports that he does not use drugs.  MEDICARE WELLNESS  OBJECTIVES: Physical activity:   Cardiac risk factors:   Depression/mood screen:   Depression screen [facility] 2/9 03/06/2019  Decreased Interest 0  Down, Depressed, Hopeless 0  PHQ - 2 Score 0    ADLs:  No flowsheet data found.   Cognitive Testing  Alert? Yes  Normal Appearance?Yes  Oriented to person? Yes  Place? Yes   Time? Yes  Recall of three objects?  Yes  Can perform simple calculations? Yes  Displays appropriate judgment?Yes  Can read the correct time from a watch face?Yes  EOL planning:     Objective:   There were no vitals filed for this visit. There is no height or weight on file to calculate BMI.  General appearance: alert, no distress, WD/WN, male HEENT: normocephalic, sclerae anicteric, TMs pearly, nares patent, no discharge or erythema, pharynx normal Oral cavity: no lesions, upper dentures, poor dentition Neck: supple, no lymphadenopathy, no thyromegaly, no masses Heart: RRR, normal S1, S2, no murmurs Lungs: CTA bilaterally, no wheezes, rhonchi, or rales Abdomen: +bs, soft, non tender, non distended, no masses, no hepatomegaly, no splenomegaly Musculoskeletal: nontender, no swelling, no obvious deformity Extremities: no edema, no cyanosis, no clubbing Pulses: 2+ symmetric, upper and lower extremities, normal cap refill Neurological: alert, oriented x 3, CN2-12 intact, strength normal upper extremities and lower extremities, sensation normal throughout, DTRs 2+  throughout, no cerebellar signs, gait normal Psychiatric: normal affect, behavior normal, pleasant   Medicare Attestation I have personally reviewed: The patient's medical and social history Their use of alcohol, tobacco or illicit drugs Their current medications and supplements The patient's functional ability including ADLs,fall risks, home safety risks, cognitive, and hearing and visual impairment Diet and physical activities Evidence for depression or mood disorders  The patient's weight, height,  BMI, and visual acuity have been recorded in the chart.  I have made referrals, counseling, and provided education to the patient based on review of the above and I have provided the patient with a written personalized care plan for preventive services.     Izora Ribas, NP   03/31/2020

## 2020-04-01 ENCOUNTER — Ambulatory Visit: Payer: Medicare Other | Admitting: Adult Health

## 2020-06-15 ENCOUNTER — Other Ambulatory Visit: Payer: Self-pay | Admitting: Adult Health

## 2020-06-15 DIAGNOSIS — I10 Essential (primary) hypertension: Secondary | ICD-10-CM

## 2020-06-26 IMAGING — CR DG CHEST 2V
2 series · 2 of 2 positions shown · non-contrast
Comparison: 01/14/2014

CLINICAL DATA: Smoker, hypertension

EXAM:
CHEST - 2 VIEW

[chest pa]
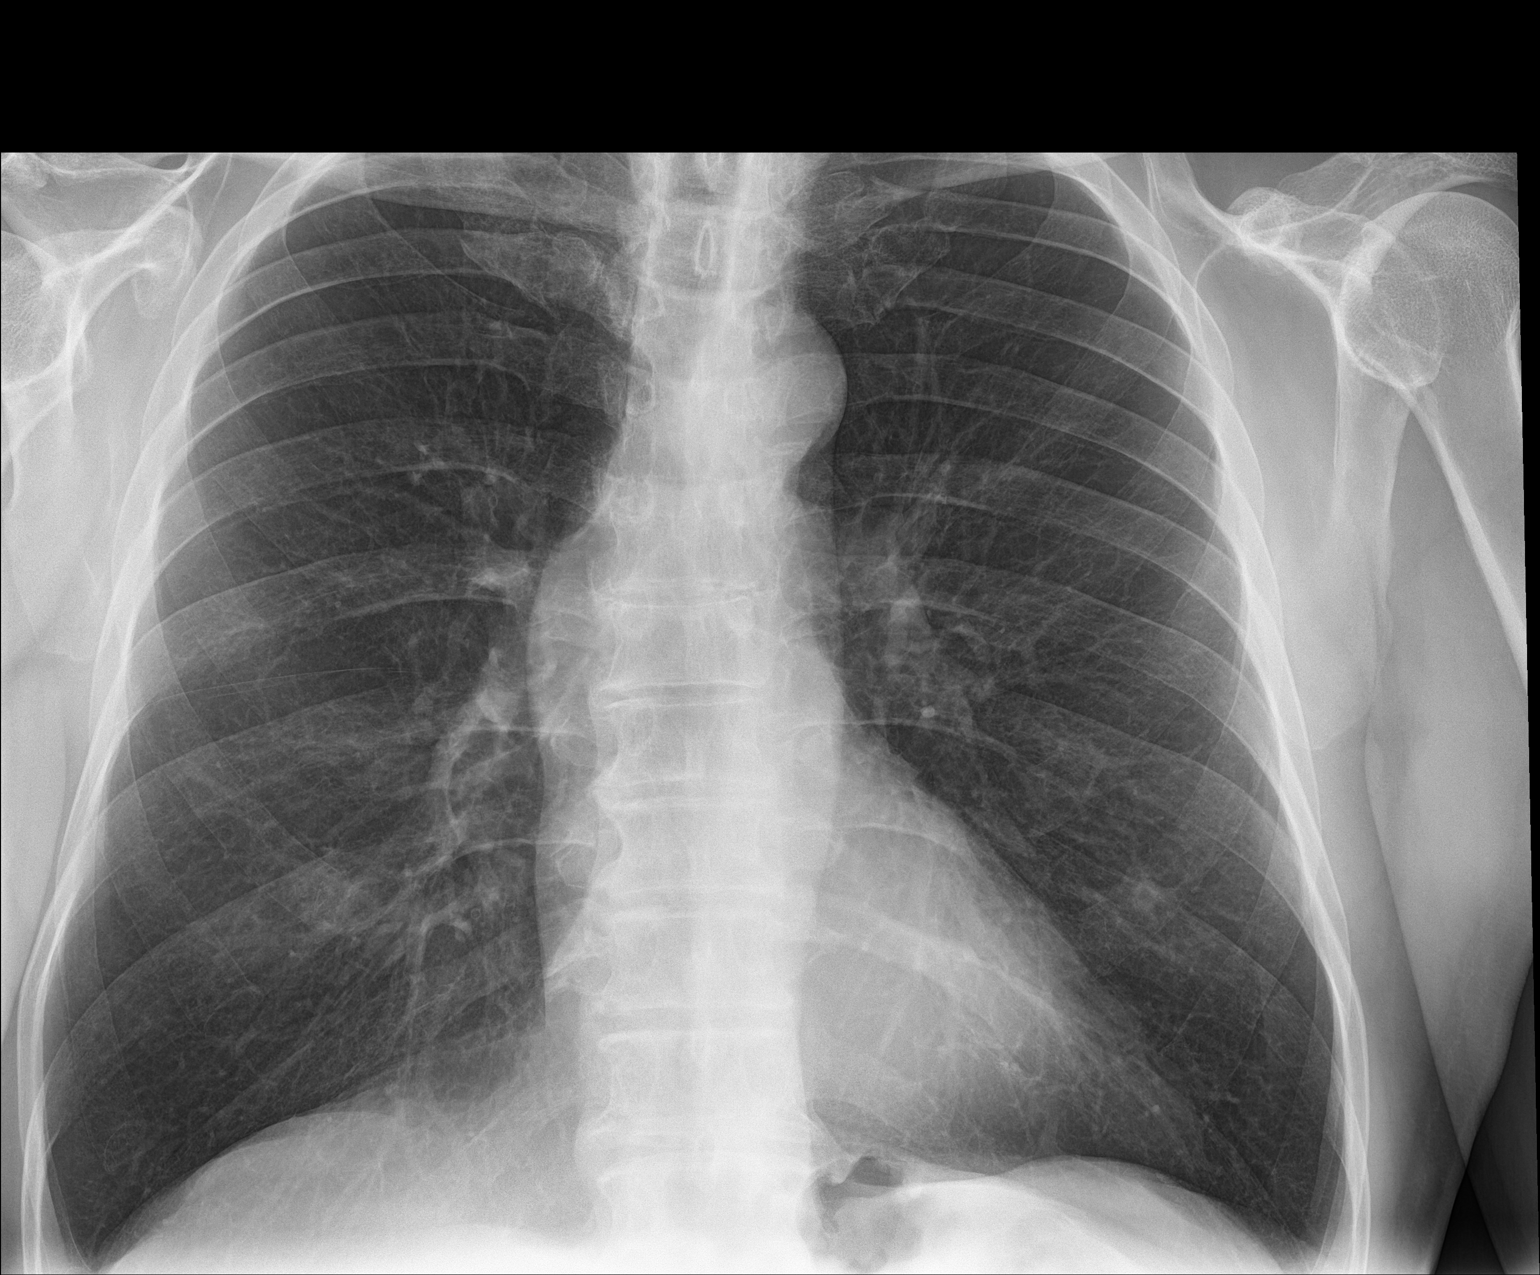

[chest lat]
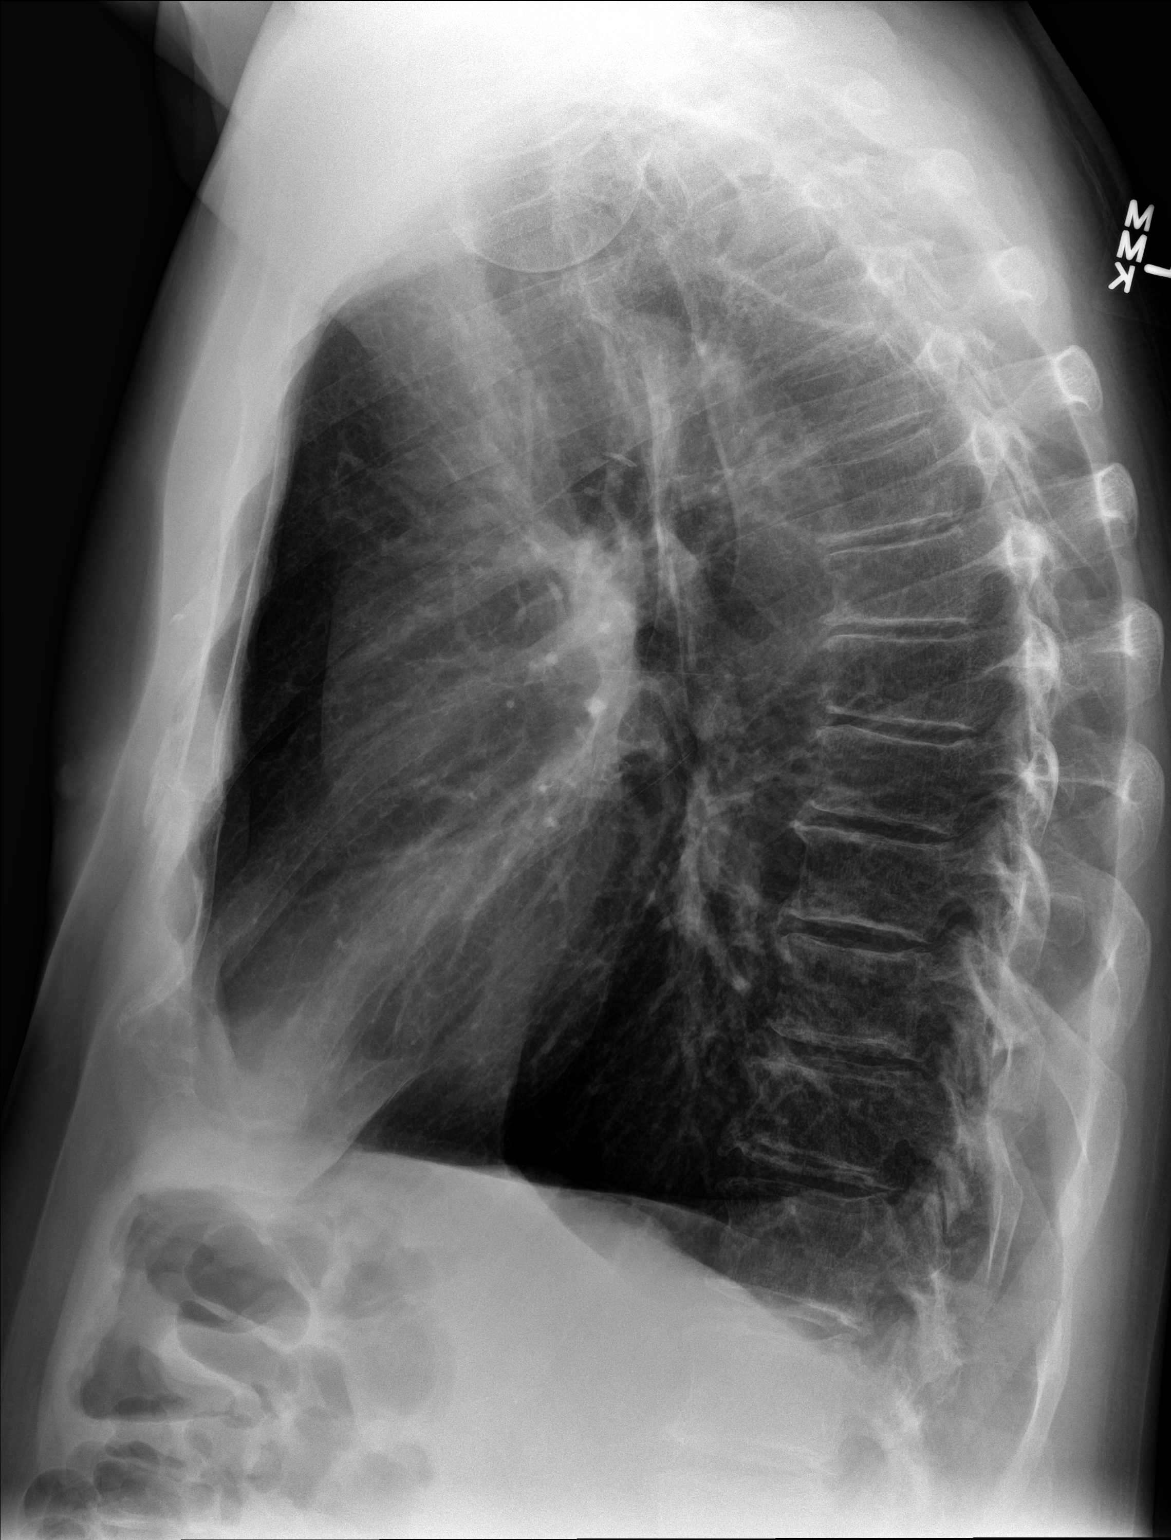

[2 of 2 positions shown; findings below may reference images not displayed]

FINDINGS: Normal heart size, mediastinal contours, and pulmonary vascularity.

Lungs appear emphysematous but clear.

LEFT nipple shadow, confirmed on lateral view.

No acute infiltrate, pleural effusion or pneumothorax.

Bones appear demineralized.
IMPRESSION: COPD changes without acute infiltrate.

## 2020-08-15 ENCOUNTER — Other Ambulatory Visit: Payer: Self-pay | Admitting: Adult Health

## 2020-08-15 DIAGNOSIS — E782 Mixed hyperlipidemia: Secondary | ICD-10-CM

## 2020-09-12 ENCOUNTER — Other Ambulatory Visit: Payer: Self-pay | Admitting: Internal Medicine

## 2020-09-12 DIAGNOSIS — I10 Essential (primary) hypertension: Secondary | ICD-10-CM

## 2020-09-13 ENCOUNTER — Other Ambulatory Visit: Payer: Self-pay | Admitting: Adult Health

## 2020-09-13 ENCOUNTER — Other Ambulatory Visit: Payer: Self-pay | Admitting: Internal Medicine

## 2020-09-13 DIAGNOSIS — E782 Mixed hyperlipidemia: Secondary | ICD-10-CM

## 2020-10-02 ENCOUNTER — Other Ambulatory Visit: Payer: Self-pay | Admitting: Adult Health

## 2020-10-02 ENCOUNTER — Other Ambulatory Visit: Payer: Self-pay | Admitting: Internal Medicine

## 2020-10-02 DIAGNOSIS — I10 Essential (primary) hypertension: Secondary | ICD-10-CM

## 2020-10-02 DIAGNOSIS — E782 Mixed hyperlipidemia: Secondary | ICD-10-CM

## 2020-10-02 MED ORDER — BISOPROLOL-HYDROCHLOROTHIAZIDE 10-6.25 MG PO TABS: ORAL_TABLET | ORAL | 0 refills | Status: DC

## 2020-10-07 ENCOUNTER — Encounter: Payer: Medicare Other | Admitting: Internal Medicine

## 2020-10-26 ENCOUNTER — Other Ambulatory Visit: Payer: Self-pay | Admitting: Adult Health

## 2020-10-26 DIAGNOSIS — I10 Essential (primary) hypertension: Secondary | ICD-10-CM

## 2020-10-26 DIAGNOSIS — E782 Mixed hyperlipidemia: Secondary | ICD-10-CM

## 2020-11-21 NOTE — Progress Notes (Signed)
CPE Assessment:   Encounter for Annual Physical Exam with abnormal findings Due annually  Health Maintenance reviewed Healthy lifestyle reviewed and goals set  Hypertension Persistently elevated - start olmesartan 20 mg daily, increase to 40 mg if needed Plan to taper off of BB once BP allows due to AVB  Monitor blood pressure at home; call if consistently over 130/80 Continue DASH diet.   Reminder to go to the ER if any CP, SOB, nausea, dizziness, severe HA, changes vision/speech, left arm numbness and tingling and jaw pain.  First degree AVB Avoid rate controlling agents Not new finding; denies concerning sx Recheck EKG annually and PRN  Cigarette nicotine dependence without complication (37+ pack year history) Smoking cessation-  instruction/counseling given, counseled patient on the dangers of tobacco use, advised patient to stop smoking, and reviewed strategies to maximize success, patient not ready to quit at this time.   -lung cancer screening with low dose CT discussed as recommended by guidelines based on age, number of pack year history.  Discussed risks of screening including but not limited to false positives on xray, further testing or consultation with specialist, and possible false negative CT as well. Understanding expressed and wishes to proceed with CT testing. Order placed.   Alcohol abuse Counseled on decreasing, risks of excess alcohol no hx of DTS Does not drink daily  Discussed goal to reduce to max ~6/day  Glucose managent  Discussed general issues about diabetes pathophysiology and management., Educational material distributed., Suggested low cholesterol diet., Encouraged aerobic exercise., Discussed foot care., Encouraged to get yearly retinal exam.  Mixed hyperlipidemia -continue medications, check lipids, decrease fatty foods, increase activity.   Medication management Monitor  Vitamin D deficiency Continue supplement, defer check to next OV due to  insurance/cost  Overweight Long discussion about weight loss, diet, and exercise Recommended diet heavy in fruits and veggies and low in animal meats, cheeses, and dairy products, appropriate calorie intake Discussed appropriate weight for height  Follow up at next visit  BPH With new nocturia, mild LUTS Check PSA annually  Declines medications at this time; will continue to evaluate  History of colon polyps Overdue recall colonoscopy - GI referral placed  Orders Placed This Encounter  Procedures   CT CHEST LUNG CA SCREEN LOW DOSE W/O CM   CBC with Differential/Platelet   COMPLETE METABOLIC PANEL WITH GFR   Magnesium   Lipid panel   TSH   Hemoglobin A1c   PSA   Microalbumin / creatinine urine ratio   Urinalysis, Routine w reflex microscopic   Ambulatory referral to Gastroenterology   EKG 12-Lead     Over 30 minutes of exam, counseling, chart review, and critical decision making was performed Future Appointments  Date Time Provider [place]  12/26/2020  9:00 AM Liane Comber, NP [facility] None  02/25/2021 11:00 AM Liane Comber, NP [facility] None  05/28/2021 10:30 AM Unk Pinto, MD [facility] None  11/25/2021  3:00 PM Liane Comber, NP [facility] None    Plan:   During the course of the visit the patient was educated and counseled about appropriate screening and preventive services including:   Pneumococcal vaccine  Influenza vaccine Prevnar 13 Td vaccine Screening electrocardiogram Colorectal cancer screening Diabetes screening Glaucoma screening Nutrition counseling     Subjective:  Michael Fields is a 70 y.o. male who presents for Medicare CPE. He has Nicotine dependence; Alcohol abuse; HTN; Other abnormal glucose; Vitamin D deficiency; Mixed hyperlipidemia; Medication management; Overweight (BMI 25.0-29.9); COPD (chronic obstructive pulmonary  disease) ([place]); and Benign prostatic hyperplasia with weak urinary stream on their problem  list.  He is single, 3 kids, 7 or 8 grand kids. He is retired from [employer].   Smoking he currently continues to smoke 0.5--1 pack a day; discussed risks associated with smoking, patient is not ready to quit. He has 50+, has never had CT screening. Discussed and agreeable.   BMI is Body mass index is 26.69 kg/m., he has been working on diet and exercise, no intentional exercise but works in his yard and will ride bike occasionally, not on reguar basis.  Wt Readings from Last 3 Encounters:  11/25/20 186 lb (84.4 kg)  12/19/19 184 lb 12.8 oz (83.8 kg)  03/06/19 188 lb 3.2 oz (85.4 kg)   His blood pressure has been controlled at home, today their BP is BP: (!) 158/70, similar on manual recheck by provider He does not workout, does yard work. He denies chest pain, shortness of breath, dizziness.    He is on cholesterol medication, crestor 40 will occ miss a day and denies myalgias. His cholesterol is at goal of LDL <70. The cholesterol last visit was:   Lab Results  Component Value Date   CHOL 123 12/19/2019   HDL 62 12/19/2019   LDLCALC 42 12/19/2019   TRIG 106 12/19/2019   CHOLHDL 2.0 12/19/2019   He has been working on diet and exercise for glucose management, and denies foot ulcerations, hyperglycemia, hypoglycemia , increased appetite, nausea, paresthesia of the feet, polydipsia, polyuria, visual disturbances, vomiting and weight loss. Last A1C in the office was:  Lab Results  Component Value Date   HGBA1C 5.5 03/06/2019   Last GFR Lab Results  Component Value Date   GFRNONAA 81 12/19/2019   Patient is on Vitamin D supplement, takes 5000 IU but inconsistently (few days/week)   Lab Results  Component Value Date   VD25OH 31 12/19/2019     He reports intermittent slow/weak stream, may have nocturia 0-3 times/night. Last PSA:  Lab Results  Component Value Date   PSA 1.9 08/31/2018   PSA 2.4 08/02/2017   PSA 1.8 07/22/2016      Medication  Review: Current Outpatient Medications on File Prior to Visit  Medication Sig Dispense Refill   aspirin 325 MG tablet Take 325 mg by mouth as needed.     bisoprolol-hydrochlorothiazide (ZIAC) 10-6.25 MG tablet TAKE 1 TABLET DAILY FOR BLOOD PRESSURE. MUST HAVE OFFICE VISIT PRIOR TO FURTHER REFILLS. 29 tablet 0   Chlorpheniramine Maleate (ALLERGY PO) Take by mouth daily.     Cholecalciferol (VITAMIN D) 125 MCG (5000 UT) CAPS Take 10,000 Units by mouth daily. (Patient taking differently: Take 5,000 Units by mouth daily.)     rosuvastatin (CRESTOR) 40 MG tablet TAKE 1 TABLET DAILY FOR CHOLESTEROL. MUST HAVE OFFICE VISIT PRIOR TO FURTHER REFILLS. 29 tablet 0   No current facility-administered medications on file prior to visit.    Current Problems (verified) Patient Active Problem List   Diagnosis Date Noted   Benign prostatic hyperplasia with weak urinary stream 11/25/2020   COPD (chronic obstructive pulmonary disease) ([place]) 03/07/2018   Overweight (BMI 25.0-29.9) 02/05/2015   Medication management 10/30/2014   Mixed hyperlipidemia 06/24/2014   HTN 05/14/2014   Other abnormal glucose 05/14/2014   Vitamin D deficiency 05/14/2014   Nicotine dependence 01/17/2014   Alcohol abuse 01/17/2014    Screening Tests Immunization History  Administered Date(s) Administered   Influenza, High Dose Seasonal PF 12/31/2016, 02/07/2018, 02/04/2019  Influenza-Unspecified 01/06/2016, 12/31/2016, 02/04/2019   Moderna Sars-Covid-2 Vaccination 10/03/2019, 10/28/2019   Pneumococcal Conjugate-13 10/01/2015, 02/04/2019   Pneumococcal Polysaccharide-23 01/26/2017   Tdap 01/14/2014    Preventative care: Last colonoscopy: 02/2016 - 3 year recall Dr. Loletha Carrow- overdue - referral placed  CXR 02/2018 -COPD  smokes x 55 years - discussed low dose CT- order placed  Prior vaccinations: TD or Tdap: 2015  Influenza: 2021 Pneumococcal: 2018 Prevnar13: 2017 Shingles/Zostavax: Declined Covid 19: 2/2, moderna    Names of Other Physician/Practitioners you currently use: 1. [facility] here for primary care 2. Does not see one, eye doctor, last visit NONE 3. Does not see one, dentist, last visit remote, no concerns   Patient Care Team: Unk Pinto, MD as PCP - General (Internal Medicine) Charlotte Crumb, MD as Consulting Physician (Orthopedic Surgery) Altamese Crawford, MD as Consulting Physician (Orthopedic Surgery)  Allergies No Known Allergies  SURGICAL HISTORY He  has a past surgical history that includes Brain surgery; Hernia repair (2012); ORIF wrist fracture (Right, 04/02/2013); and ORIF acetabular fracture (Right, 01/15/2014). FAMILY HISTORY His family history is not on file.    SOCIAL HISTORY He  reports that he has been smoking cigarettes. He started smoking about 58 years ago. He has a 42.75 pack-year smoking history. He has never used smokeless tobacco. He reports current alcohol use of about 30.0 standard drinks of alcohol per week. He reports that he does not use drugs.   Review of Systems  Constitutional:  Negative for malaise/fatigue and weight loss.  HENT:  Negative for hearing loss and tinnitus.   Eyes:  Negative for blurred vision and double vision.  Respiratory:  Negative for cough, sputum production, shortness of breath and wheezing.   Cardiovascular:  Negative for chest pain, palpitations, orthopnea, claudication, leg swelling and PND.  Gastrointestinal:  Negative for abdominal pain, blood in stool, constipation, diarrhea, heartburn, melena, nausea and vomiting.  Genitourinary: Negative.   Musculoskeletal:  Negative for falls, joint pain and myalgias.  Skin:  Negative for rash.  Neurological:  Negative for dizziness, tingling, sensory change, weakness and headaches.  Endo/Heme/Allergies:  Negative for polydipsia.  Psychiatric/Behavioral: Negative.  Negative for depression, memory loss, substance abuse and suicidal ideas. The  patient is not nervous/anxious and does not have insomnia.   All other systems reviewed and are negative.   Objective:   Today's Vitals   11/25/20 1456  BP: (!) 158/70  Pulse: (!) 58  Temp: (!) 97.2 F (36.2 C)  SpO2: 97%  Weight: 186 lb (84.4 kg)  Height: 5\' 10"  (1.778 m)    Body mass index is 26.69 kg/m.  General appearance: alert, no distress, WD/WN, male HEENT: normocephalic, sclerae anicteric, TMs pearly, nares patent, no discharge or erythema, pharynx normal Oral cavity: no lesions, upper dentures, poor dentition Neck: supple, no lymphadenopathy, no thyromegaly, no masses Heart: RRR, normal S1, S2, no murmurs Lungs: CTA bilaterally, no wheezes, rhonchi, or rales Abdomen: +bs, soft, non tender, non distended, no masses, no hepatomegaly, no splenomegaly Musculoskeletal: nontender, no swelling, no obvious deformity Extremities: no edema, no cyanosis, no clubbing Pulses: 2+ symmetric, upper and lower extremities, normal cap refill Neurological: alert, oriented x 3, CN2-12 intact, strength normal upper extremities and lower extremities, sensation normal throughout, DTRs 2+ throughout, no cerebellar signs, gait normal Psychiatric: normal affect, behavior normal, pleasant  GU: doing regular self checks, declined  EKG:  sinus bradycardia, 1st degree AVB   Izora Ribas, NP   11/25/2020

## 2020-11-24 ENCOUNTER — Other Ambulatory Visit: Payer: Self-pay | Admitting: Internal Medicine

## 2020-11-24 DIAGNOSIS — E782 Mixed hyperlipidemia: Secondary | ICD-10-CM

## 2020-11-25 ENCOUNTER — Encounter: Payer: Self-pay | Admitting: Adult Health

## 2020-11-25 ENCOUNTER — Ambulatory Visit (INDEPENDENT_AMBULATORY_CARE_PROVIDER_SITE_OTHER): Payer: Medicare Other | Admitting: Adult Health

## 2020-11-25 ENCOUNTER — Other Ambulatory Visit: Payer: Self-pay

## 2020-11-25 VITALS — BP 158/70 | HR 58 | Temp 97.2°F | Ht 70.0 in | Wt 186.0 lb

## 2020-11-25 DIAGNOSIS — Z79899 Other long term (current) drug therapy: Secondary | ICD-10-CM | POA: Diagnosis not present

## 2020-11-25 DIAGNOSIS — I10 Essential (primary) hypertension: Secondary | ICD-10-CM | POA: Diagnosis not present

## 2020-11-25 DIAGNOSIS — Z1329 Encounter for screening for other suspected endocrine disorder: Secondary | ICD-10-CM | POA: Diagnosis not present

## 2020-11-25 DIAGNOSIS — Z0000 Encounter for general adult medical examination without abnormal findings: Secondary | ICD-10-CM

## 2020-11-25 DIAGNOSIS — Z1389 Encounter for screening for other disorder: Secondary | ICD-10-CM | POA: Diagnosis not present

## 2020-11-25 DIAGNOSIS — F1010 Alcohol abuse, uncomplicated: Secondary | ICD-10-CM

## 2020-11-25 DIAGNOSIS — Z860100 Personal history of colon polyps, unspecified: Secondary | ICD-10-CM

## 2020-11-25 DIAGNOSIS — F17210 Nicotine dependence, cigarettes, uncomplicated: Secondary | ICD-10-CM | POA: Diagnosis not present

## 2020-11-25 DIAGNOSIS — Z131 Encounter for screening for diabetes mellitus: Secondary | ICD-10-CM

## 2020-11-25 DIAGNOSIS — E782 Mixed hyperlipidemia: Secondary | ICD-10-CM

## 2020-11-25 DIAGNOSIS — R3912 Poor urinary stream: Secondary | ICD-10-CM | POA: Diagnosis not present

## 2020-11-25 DIAGNOSIS — E559 Vitamin D deficiency, unspecified: Secondary | ICD-10-CM

## 2020-11-25 DIAGNOSIS — I440 Atrioventricular block, first degree: Secondary | ICD-10-CM | POA: Insufficient documentation

## 2020-11-25 DIAGNOSIS — Z125 Encounter for screening for malignant neoplasm of prostate: Secondary | ICD-10-CM | POA: Diagnosis not present

## 2020-11-25 DIAGNOSIS — Z136 Encounter for screening for cardiovascular disorders: Secondary | ICD-10-CM | POA: Diagnosis not present

## 2020-11-25 DIAGNOSIS — J449 Chronic obstructive pulmonary disease, unspecified: Secondary | ICD-10-CM | POA: Diagnosis not present

## 2020-11-25 DIAGNOSIS — N401 Enlarged prostate with lower urinary tract symptoms: Secondary | ICD-10-CM

## 2020-11-25 DIAGNOSIS — Z122 Encounter for screening for malignant neoplasm of respiratory organs: Secondary | ICD-10-CM

## 2020-11-25 DIAGNOSIS — E663 Overweight: Secondary | ICD-10-CM

## 2020-11-25 DIAGNOSIS — R7309 Other abnormal glucose: Secondary | ICD-10-CM | POA: Diagnosis not present

## 2020-11-25 MED ORDER — BLOOD PRESSURE CUFF MISC: 0 refills | Status: AC

## 2020-11-25 MED ORDER — OLMESARTAN MEDOXOMIL 40 MG PO TABS: ORAL_TABLET | ORAL | 0 refills | Status: DC

## 2020-11-25 NOTE — Patient Instructions (Signed)
Michael Fields , Thank you for taking time to come for your Annual Wellness Visit. I appreciate your ongoing commitment to your health goals. Please review the following plan we discussed and let me know if I can assist you in the future.   These are the goals we discussed:  Goals      Blood Pressure < 130/80     Exercise 150 min/wk Moderate Activity     Reduce alcohol intake     Ideally no more than 2 per serving, max 14/week           Olmesartan Tablets What is this medication? OLMESARTAN (all mi SAR tan) is an angiotensin II receptor blocker, also knownas an ARB. It treats high blood pressure. This medicine may be used for other purposes; ask your health care provider orpharmacist if you have questions. COMMON BRAND NAME(S): Benicar What should I tell my care team before I take this medication? They need to know if you have any of these conditions: if you are on a special diet, such as a low-salt diet kidney or liver disease an unusual or allergic reaction to olmesartan, other medicines, foods, dyes, or preservatives pregnant or trying to get pregnant breast-feeding How should I use this medication? Take this drug by mouth. Take it as directed on the prescription label at the same time every day. You can take it with or without food. If it upsets your stomach, take it with food. Keep taking it unless your health care providertells you to stop. Talk to your health care provider about the use of this drug in children. While it may be prescribed for children as young as 6 for selected conditions,precautions do apply. Overdosage: If you think you have taken too much of this medicine contact apoison control center or emergency room at once. NOTE: This medicine is only for you. Do not share this medicine with others. What if I miss a dose? If you miss a dose, take it as soon as you can. If it is almost time for yournext dose, take only that dose. Do not take double or extra doses. What  may interact with this medication? blood pressure medicines diuretics, especially triamterene, spironolactone or amiloride potassium salts or potassium supplements This list may not describe all possible interactions. Give your health care provider a list of all the medicines, herbs, non-prescription drugs, or dietary supplements you use. Also tell them if you smoke, drink alcohol, or use illegaldrugs. Some items may interact with your medicine. What should I watch for while using this medication? Visit your doctor or health care professional for regular checks on your progress. Check your blood pressure as directed. Ask your doctor or health care professional what your blood pressure should be and when you should contact him or her. Call your doctor or health care professional if you notice an irregularor fast heart beat. Women should inform their doctor if they wish to become pregnant or think they might be pregnant. There is a potential for serious side effects to an unborn child, particularly in the second or third trimester. Talk to your health careprofessional or pharmacist for more information. You may get drowsy or dizzy. Do not drive, use machinery, or do anything that needs mental alertness until you know how this drug affects you. Do not stand or sit up quickly, especially if you are an older patient. This reduces the risk of dizzy or fainting spells. Alcohol can make you more drowsy and dizzy.Avoid alcoholic drinks. Avoid salt  substitutes unless you are told otherwise by your doctor or healthcare professional. Do not treat yourself for coughs, colds, or pain while you are taking this medicine without asking your doctor or health care professional for advice.Some ingredients may increase your blood pressure. What side effects may I notice from receiving this medication? Side effects that you should report to your doctor or health care professionalas soon as possible: confusion, dizziness,  light headedness or fainting spells decreased amount of urine passed diarrhea difficulty breathing or swallowing, hoarseness, or tightening of the throat fast or irregular heart beat, palpitations, or chest pain skin rash, itching swelling of your face, lips, tongue, hands, or feet vomiting weight loss Side effects that usually do not require medical attention (report to yourdoctor or health care professional if they continue or are bothersome): cough decreased sexual function or desire headache nasal congestion or stuffiness nausea sore or cramping muscles This list may not describe all possible side effects. Call your doctor for medical advice about side effects. You may report side effects to FDA at1-800-FDA-1088. Where should I keep my medication? Keep out of the reach of children and pets. Store at room temperature between 20 and 25 degrees C (68 and 77 degrees F).Throw away any unused drug after the expiration date. NOTE: This sheet is a summary. It may not cover all possible information. If you have questions about this medicine, talk to your doctor, pharmacist, orhealth care provider.  2022 Elsevier/Gold Standard (2018-12-13 13:23:55)

## 2020-11-26 LAB — CBC WITH DIFFERENTIAL/PLATELET
Absolute Monocytes: 710 cells/uL (ref 200–950)
Basophils Absolute: 43 cells/uL (ref 0–200)
Basophils Relative: 0.6 %
Eosinophils Absolute: 163 cells/uL (ref 15–500)
Eosinophils Relative: 2.3 %
HCT: 44.3 % (ref 38.5–50.0)
Hemoglobin: 14.5 g/dL (ref 13.2–17.1)
Lymphs Abs: 2570 cells/uL (ref 850–3900)
MCH: 31.2 pg (ref 27.0–33.0)
MCHC: 32.7 g/dL (ref 32.0–36.0)
MCV: 95.3 fL (ref 80.0–100.0)
MPV: 11.5 fL (ref 7.5–12.5)
Monocytes Relative: 10 %
Neutro Abs: 3614 cells/uL (ref 1500–7800)
Neutrophils Relative %: 50.9 %
Platelets: 200 10*3/uL (ref 140–400)
RBC: 4.65 10*6/uL (ref 4.20–5.80)
RDW: 12.6 % (ref 11.0–15.0)
Total Lymphocyte: 36.2 %
WBC: 7.1 10*3/uL (ref 3.8–10.8)

## 2020-11-26 LAB — TSH: TSH: 2.41 mIU/L (ref 0.40–4.50)

## 2020-11-26 LAB — URINALYSIS, ROUTINE W REFLEX MICROSCOPIC
Bilirubin Urine: NEGATIVE
Glucose, UA: NEGATIVE
Hgb urine dipstick: NEGATIVE
Ketones, ur: NEGATIVE
Leukocytes,Ua: NEGATIVE
Nitrite: NEGATIVE
Protein, ur: NEGATIVE
Specific Gravity, Urine: 1.007 (ref 1.001–1.035)
pH: 5.5 (ref 5.0–8.0)

## 2020-11-26 LAB — LIPID PANEL
Cholesterol: 135 mg/dL (ref <200)
HDL: 63 mg/dL (ref >=40)
LDL Cholesterol (Calc): 44 mg/dL (calc)
Non-HDL Cholesterol (Calc): 72 mg/dL (calc) (ref <130)
Total CHOL/HDL Ratio: 2.1 (calc) (ref <5.0)
Triglycerides: 223 mg/dL — ABNORMAL HIGH (ref <150)

## 2020-11-26 LAB — COMPLETE METABOLIC PANEL WITH GFR
AG Ratio: 1.9 (calc) (ref 1.0–2.5)
ALT: 24 U/L (ref 9–46)
AST: 29 U/L (ref 10–35)
Albumin: 4.6 g/dL (ref 3.6–5.1)
Alkaline phosphatase (APISO): 56 U/L (ref 35–144)
BUN: 11 mg/dL (ref 7–25)
CO2: 28 mmol/L (ref 20–32)
Calcium: 10.1 mg/dL (ref 8.6–10.3)
Chloride: 104 mmol/L (ref 98–110)
Creat: 0.93 mg/dL (ref 0.70–1.18)
GFR, Est African American: 95 mL/min/{1.73_m2} (ref >=60)
GFR, Est Non African American: 82 mL/min/{1.73_m2} (ref >=60)
Globulin: 2.4 g/dL (calc) (ref 1.9–3.7)
Glucose, Bld: 89 mg/dL (ref 65–99)
Potassium: 4.9 mmol/L (ref 3.5–5.3)
Sodium: 140 mmol/L (ref 135–146)
Total Bilirubin: 0.5 mg/dL (ref 0.2–1.2)
Total Protein: 7 g/dL (ref 6.1–8.1)

## 2020-11-26 LAB — MAGNESIUM: Magnesium: 2.2 mg/dL (ref 1.5–2.5)

## 2020-11-26 LAB — HEMOGLOBIN A1C
Hgb A1c MFr Bld: 5.7 % of total Hgb — ABNORMAL HIGH (ref <5.7)
Mean Plasma Glucose: 117 mg/dL
eAG (mmol/L): 6.5 mmol/L

## 2020-11-26 LAB — MICROALBUMIN / CREATININE URINE RATIO
Creatinine, Urine: 51 mg/dL (ref 20–320)
Microalb Creat Ratio: 4 mcg/mg creat (ref <30)
Microalb, Ur: 0.2 mg/dL

## 2020-11-26 LAB — PSA: PSA: 1.53 ng/mL (ref <=4.00)

## 2020-12-11 ENCOUNTER — Other Ambulatory Visit: Payer: Self-pay | Admitting: Internal Medicine

## 2020-12-11 DIAGNOSIS — E782 Mixed hyperlipidemia: Secondary | ICD-10-CM

## 2020-12-22 ENCOUNTER — Other Ambulatory Visit: Payer: Self-pay

## 2020-12-22 DIAGNOSIS — E782 Mixed hyperlipidemia: Secondary | ICD-10-CM

## 2020-12-22 DIAGNOSIS — I10 Essential (primary) hypertension: Secondary | ICD-10-CM

## 2020-12-22 MED ORDER — BISOPROLOL-HYDROCHLOROTHIAZIDE 10-6.25 MG PO TABS: ORAL_TABLET | ORAL | 0 refills | Status: DC

## 2020-12-22 MED ORDER — ROSUVASTATIN CALCIUM 40 MG PO TABS: ORAL_TABLET | ORAL | 0 refills | Status: DC

## 2020-12-22 MED ORDER — OLMESARTAN MEDOXOMIL 40 MG PO TABS: ORAL_TABLET | ORAL | 0 refills | Status: AC

## 2020-12-26 ENCOUNTER — Ambulatory Visit: Payer: Medicare Other | Admitting: Adult Health

## 2021-02-25 ENCOUNTER — Ambulatory Visit: Payer: Medicare Other | Admitting: Adult Health

## 2021-03-28 ENCOUNTER — Other Ambulatory Visit: Payer: Self-pay | Admitting: Adult Health

## 2021-03-28 DIAGNOSIS — I10 Essential (primary) hypertension: Secondary | ICD-10-CM

## 2021-04-03 ENCOUNTER — Other Ambulatory Visit: Payer: Self-pay | Admitting: Adult Health

## 2021-04-03 DIAGNOSIS — E782 Mixed hyperlipidemia: Secondary | ICD-10-CM

## 2021-05-27 NOTE — Progress Notes (Signed)
° °  Patient Left without being seen,   because he didn't want   to have lab work drawn   for his 6 month check-up &   Didn't want to wait to discuss with provider .  Will close chart

## 2021-05-28 ENCOUNTER — Encounter: Payer: Self-pay | Admitting: Internal Medicine

## 2021-05-28 ENCOUNTER — Ambulatory Visit: Payer: Medicare Other | Admitting: Internal Medicine

## 2021-05-28 ENCOUNTER — Other Ambulatory Visit: Payer: Self-pay

## 2021-05-28 DIAGNOSIS — E559 Vitamin D deficiency, unspecified: Secondary | ICD-10-CM

## 2021-05-28 DIAGNOSIS — I10 Essential (primary) hypertension: Secondary | ICD-10-CM

## 2021-05-28 DIAGNOSIS — R7309 Other abnormal glucose: Secondary | ICD-10-CM

## 2021-05-28 DIAGNOSIS — J449 Chronic obstructive pulmonary disease, unspecified: Secondary | ICD-10-CM

## 2021-05-28 DIAGNOSIS — Z91199 Patient's noncompliance with other medical treatment and regimen due to unspecified reason: Secondary | ICD-10-CM

## 2021-05-28 DIAGNOSIS — Z79899 Other long term (current) drug therapy: Secondary | ICD-10-CM

## 2021-05-28 DIAGNOSIS — E782 Mixed hyperlipidemia: Secondary | ICD-10-CM

## 2021-06-01 ENCOUNTER — Encounter: Payer: Self-pay | Admitting: Adult Health

## 2021-07-01 DIAGNOSIS — I10 Essential (primary) hypertension: Secondary | ICD-10-CM | POA: Diagnosis not present

## 2021-07-01 DIAGNOSIS — E782 Mixed hyperlipidemia: Secondary | ICD-10-CM | POA: Diagnosis not present

## 2021-09-16 ENCOUNTER — Other Ambulatory Visit: Payer: Self-pay | Admitting: Adult Health

## 2021-09-16 DIAGNOSIS — I10 Essential (primary) hypertension: Secondary | ICD-10-CM

## 2021-09-16 DIAGNOSIS — E782 Mixed hyperlipidemia: Secondary | ICD-10-CM

## 2021-11-25 ENCOUNTER — Encounter: Payer: Medicare Other | Admitting: Adult Health

## 2022-01-13 DIAGNOSIS — I10 Essential (primary) hypertension: Secondary | ICD-10-CM | POA: Diagnosis not present

## 2022-01-13 DIAGNOSIS — E782 Mixed hyperlipidemia: Secondary | ICD-10-CM | POA: Diagnosis not present

## 2022-01-20 DIAGNOSIS — E782 Mixed hyperlipidemia: Secondary | ICD-10-CM | POA: Diagnosis not present

## 2022-01-20 DIAGNOSIS — I10 Essential (primary) hypertension: Secondary | ICD-10-CM | POA: Diagnosis not present

## 2022-03-05 DIAGNOSIS — Z23 Encounter for immunization: Secondary | ICD-10-CM | POA: Diagnosis not present

## 2022-03-23 DIAGNOSIS — I10 Essential (primary) hypertension: Secondary | ICD-10-CM | POA: Diagnosis not present

## 2022-03-23 DIAGNOSIS — E782 Mixed hyperlipidemia: Secondary | ICD-10-CM | POA: Diagnosis not present

## 2022-07-13 DIAGNOSIS — I10 Essential (primary) hypertension: Secondary | ICD-10-CM | POA: Diagnosis not present

## 2022-07-13 DIAGNOSIS — Z23 Encounter for immunization: Secondary | ICD-10-CM | POA: Diagnosis not present

## 2022-07-13 DIAGNOSIS — R5383 Other fatigue: Secondary | ICD-10-CM | POA: Diagnosis not present

## 2022-07-13 DIAGNOSIS — E782 Mixed hyperlipidemia: Secondary | ICD-10-CM | POA: Diagnosis not present

## 2022-07-13 DIAGNOSIS — Z0000 Encounter for general adult medical examination without abnormal findings: Secondary | ICD-10-CM | POA: Diagnosis not present

## 2022-07-20 DIAGNOSIS — I10 Essential (primary) hypertension: Secondary | ICD-10-CM | POA: Diagnosis not present

## 2022-07-20 DIAGNOSIS — J40 Bronchitis, not specified as acute or chronic: Secondary | ICD-10-CM | POA: Diagnosis not present

## 2022-07-20 DIAGNOSIS — N182 Chronic kidney disease, stage 2 (mild): Secondary | ICD-10-CM | POA: Diagnosis not present

## 2022-07-20 DIAGNOSIS — E782 Mixed hyperlipidemia: Secondary | ICD-10-CM | POA: Diagnosis not present

## 2023-05-11 DIAGNOSIS — Z23 Encounter for immunization: Secondary | ICD-10-CM | POA: Diagnosis not present

## 2023-07-19 DIAGNOSIS — I10 Essential (primary) hypertension: Secondary | ICD-10-CM | POA: Diagnosis not present

## 2023-07-19 DIAGNOSIS — E782 Mixed hyperlipidemia: Secondary | ICD-10-CM | POA: Diagnosis not present

## 2023-07-26 DIAGNOSIS — R5383 Other fatigue: Secondary | ICD-10-CM | POA: Diagnosis not present

## 2023-07-26 DIAGNOSIS — R21 Rash and other nonspecific skin eruption: Secondary | ICD-10-CM | POA: Diagnosis not present

## 2023-07-26 DIAGNOSIS — I10 Essential (primary) hypertension: Secondary | ICD-10-CM | POA: Diagnosis not present

## 2023-07-26 DIAGNOSIS — E782 Mixed hyperlipidemia: Secondary | ICD-10-CM | POA: Diagnosis not present

## 2023-07-26 DIAGNOSIS — N182 Chronic kidney disease, stage 2 (mild): Secondary | ICD-10-CM | POA: Diagnosis not present

## 2023-07-26 DIAGNOSIS — Z0000 Encounter for general adult medical examination without abnormal findings: Secondary | ICD-10-CM | POA: Diagnosis not present

## 2024-03-21 DIAGNOSIS — Z23 Encounter for immunization: Secondary | ICD-10-CM | POA: Diagnosis not present
